# Patient Record
Sex: Female | Born: 1975 | Race: Black or African American | Hispanic: No | Marital: Single | Smoking: Former smoker
Health system: Southern US, Community
[De-identification: ages and names within clinical notes are randomized; demographics above are authoritative.]

## PROBLEM LIST (undated history)

## (undated) DIAGNOSIS — J449 Chronic obstructive pulmonary disease, unspecified: Secondary | ICD-10-CM

## (undated) DIAGNOSIS — K219 Gastro-esophageal reflux disease without esophagitis: Secondary | ICD-10-CM

## (undated) DIAGNOSIS — N6009 Solitary cyst of unspecified breast: Secondary | ICD-10-CM

## (undated) DIAGNOSIS — E7800 Pure hypercholesterolemia, unspecified: Secondary | ICD-10-CM

## (undated) HISTORY — PX: OOPHORECTOMY: SHX86

## (undated) HISTORY — PX: BREAST SURGERY: SHX581

## (undated) HISTORY — PX: HERNIA REPAIR: SHX51

---

## 2005-04-17 ENCOUNTER — Emergency Department (HOSPITAL_COMMUNITY): Admission: EM | Admit: 2005-04-17 | Discharge: 2005-04-17 | Payer: Self-pay | Admitting: Emergency Medicine

## 2010-02-11 ENCOUNTER — Ambulatory Visit (HOSPITAL_COMMUNITY): Admission: RE | Admit: 2010-02-11 | Discharge: 2010-02-11 | Payer: Self-pay | Admitting: Family Medicine

## 2011-12-09 ENCOUNTER — Encounter (HOSPITAL_COMMUNITY): Payer: Self-pay | Admitting: *Deleted

## 2011-12-09 ENCOUNTER — Emergency Department (HOSPITAL_COMMUNITY): Payer: BC Managed Care – PPO

## 2011-12-09 ENCOUNTER — Emergency Department (HOSPITAL_COMMUNITY)
Admission: EM | Admit: 2011-12-09 | Discharge: 2011-12-09 | Disposition: A | Discharge status: Home / Self Care | Payer: BC Managed Care – PPO | Attending: Emergency Medicine | Admitting: Emergency Medicine

## 2011-12-09 DIAGNOSIS — K219 Gastro-esophageal reflux disease without esophagitis: Secondary | ICD-10-CM | POA: Insufficient documentation

## 2011-12-09 DIAGNOSIS — D259 Leiomyoma of uterus, unspecified: Secondary | ICD-10-CM | POA: Insufficient documentation

## 2011-12-09 DIAGNOSIS — E789 Disorder of lipoprotein metabolism, unspecified: Secondary | ICD-10-CM | POA: Insufficient documentation

## 2011-12-09 DIAGNOSIS — R630 Anorexia: Secondary | ICD-10-CM | POA: Insufficient documentation

## 2011-12-09 DIAGNOSIS — R112 Nausea with vomiting, unspecified: Secondary | ICD-10-CM | POA: Insufficient documentation

## 2011-12-09 DIAGNOSIS — Z79899 Other long term (current) drug therapy: Secondary | ICD-10-CM | POA: Insufficient documentation

## 2011-12-09 DIAGNOSIS — F17200 Nicotine dependence, unspecified, uncomplicated: Secondary | ICD-10-CM | POA: Insufficient documentation

## 2011-12-09 DIAGNOSIS — D279 Benign neoplasm of unspecified ovary: Secondary | ICD-10-CM | POA: Insufficient documentation

## 2011-12-09 DIAGNOSIS — R10819 Abdominal tenderness, unspecified site: Secondary | ICD-10-CM | POA: Insufficient documentation

## 2011-12-09 DIAGNOSIS — R197 Diarrhea, unspecified: Secondary | ICD-10-CM | POA: Insufficient documentation

## 2011-12-09 DIAGNOSIS — R109 Unspecified abdominal pain: Secondary | ICD-10-CM | POA: Insufficient documentation

## 2011-12-09 DIAGNOSIS — D219 Benign neoplasm of connective and other soft tissue, unspecified: Secondary | ICD-10-CM

## 2011-12-09 HISTORY — DX: Pure hypercholesterolemia, unspecified: E78.00

## 2011-12-09 HISTORY — DX: Gastro-esophageal reflux disease without esophagitis: K21.9

## 2011-12-09 LAB — COMPREHENSIVE METABOLIC PANEL
ALT: 27 U/L (ref 0–35)
Albumin: 4.3 g/dL (ref 3.5–5.2)
Alkaline Phosphatase: 75 U/L (ref 39–117)
BUN: 10 mg/dL (ref 6–23)
Chloride: 100 mEq/L (ref 96–112)
Creatinine, Ser: 0.7 mg/dL (ref 0.50–1.10)
GFR calc non Af Amer: >90 mL/min (ref >90)
Sodium: 135 mEq/L (ref 135–145)
Total Bilirubin: 0.1 mg/dL — ABNORMAL LOW (ref 0.3–1.2)

## 2011-12-09 LAB — URINALYSIS, ROUTINE W REFLEX MICROSCOPIC
Ketones, ur: 40 mg/dL — AB
Nitrite: NEGATIVE
Specific Gravity, Urine: >1.03 — ABNORMAL HIGH (ref 1.005–1.030)

## 2011-12-09 LAB — CBC
HCT: 38.7 % (ref 36.0–46.0)
MCH: 31.3 pg (ref 26.0–34.0)
MCHC: 33.3 g/dL (ref 30.0–36.0)
Platelets: 271 10*3/uL (ref 150–400)
RBC: 4.12 MIL/uL (ref 3.87–5.11)

## 2011-12-09 LAB — URINE MICROSCOPIC-ADD ON

## 2011-12-09 LAB — PREGNANCY, URINE: Preg Test, Ur: NEGATIVE

## 2011-12-09 LAB — LIPASE, BLOOD: Lipase: 23 U/L (ref 11–59)

## 2011-12-09 MED ORDER — SODIUM CHLORIDE 0.9 % IV SOLN: INTRAVENOUS | Status: DC

## 2011-12-09 MED ORDER — SODIUM CHLORIDE 0.9 % IV BOLUS (SEPSIS)
250.0000 mL | Freq: Once | INTRAVENOUS | Status: AC
  Administered 2011-12-09: 250 mL via INTRAVENOUS

## 2011-12-09 MED ORDER — NAPROXEN 500 MG PO TABS: 500.0000 mg | ORAL_TABLET | Freq: Two times a day (BID) | ORAL | Status: DC

## 2011-12-09 MED ORDER — HYDROMORPHONE HCL PF 1 MG/ML IJ SOLN
1.0000 mg | Freq: Once | INTRAMUSCULAR | Status: AC
  Administered 2011-12-09: 1 mg via INTRAVENOUS
  Filled 2011-12-09: qty 1

## 2011-12-09 MED ORDER — PROMETHAZINE HCL 25 MG PO TABS: 25.0000 mg | ORAL_TABLET | Freq: Four times a day (QID) | ORAL | Status: DC | PRN

## 2011-12-09 MED ORDER — IOHEXOL 300 MG/ML  SOLN
40.0000 mL | Freq: Once | INTRAMUSCULAR | Status: AC | PRN
  Administered 2011-12-09: 40 mL via ORAL

## 2011-12-09 MED ORDER — IOHEXOL 300 MG/ML  SOLN
100.0000 mL | Freq: Once | INTRAMUSCULAR | Status: AC | PRN
  Administered 2011-12-09: 100 mL via INTRAVENOUS

## 2011-12-09 MED ORDER — ONDANSETRON HCL 4 MG/2ML IJ SOLN
4.0000 mg | Freq: Once | INTRAMUSCULAR | Status: AC
  Administered 2011-12-09: 4 mg via INTRAVENOUS
  Filled 2011-12-09: qty 2

## 2011-12-09 MED ORDER — HYDROCODONE-ACETAMINOPHEN 5-325 MG PO TABS: 1.0000 | ORAL_TABLET | Freq: Four times a day (QID) | ORAL | Status: AC | PRN

## 2011-12-09 NOTE — Discharge Instructions (Signed)
Followup with GYN referral is been given to Dr. Emelda Fear.: Make an appointment. Take pain medicine anti-inflammatory medicine as needed. Take antinausea medicine as needed. Return for new or worse symptoms.

## 2011-12-09 NOTE — ED Provider Notes (Addendum)
History    This chart was scribed for Shelda Jakes, MD by Toya Smothers. The patient was seen in room APA12/APA12. Patient's care was started at 1032.   CSN: [id number]  Arrival date & time 12/09/11  1032   First MD Initiated Contact with Patient 12/09/11 1121      Chief Complaint  Patient presents with  . Abdominal Pain    (Consider location/radiation/quality/duration/timing/severity/associated sxs/prior treatment) HPI Brooke Greene is a 36 y.o. female who presents to the Emergency Department complaining of intermittent severe abdominal pain described as cramping radiating towards the left side onset one week ago. Pt. Rates pain 8/10 currently, 10 out of 10 at its worst and lists nausea, emesis, diarrhea, and decreased appetite as associated symptoms. States abdominal pain is aggravated by eating. Pt denies hematochezia, swelling in the legs, sore throat, congestion, back pain, dysuria, and the use of antibiotics. Reports she was recently evaluated by PCP for hematochezia and was treated for hemorrhoids with resolution of problem. Pts state last known menstrual period 2 days ago and regular. Pt lists PCP as [facility].    Past Medical History  Diagnosis Date  . High cholesterol   . Acid reflux     Past Surgical History  Procedure Date  . Hernia repair     No family history on file.  History  Substance Use Topics  . Smoking status: Current Everyday Smoker    Types: Cigarettes  . Smokeless tobacco: Not on file  . Alcohol Use: Yes     occasional    OB History    Grav Para Term Preterm Abortions TAB SAB Ect Mult Living                  Review of Systems  Constitutional: Positive for appetite change. Negative for fever.  HENT: Negative for rhinorrhea.   Eyes: Negative for pain.  Respiratory: Negative for cough and shortness of breath.   Cardiovascular: Negative for chest pain.  Gastrointestinal: Positive for nausea, vomiting, abdominal pain and diarrhea.    Genitourinary: Negative for dysuria.  Musculoskeletal: Negative for back pain.  Skin: Negative for rash.  Neurological: Negative for weakness and headaches.    Allergies  Penicillins  Home Medications   Current Outpatient Rx  Name Route Sig Dispense Refill  . HYDROCORTISONE 0.5 % EX CREA Topical Apply 1 application topically 2 (two) times daily. For hemorrhoids    . LORATADINE 10 MG PO TABS Oral Take 10 mg by mouth daily.    . MOMETASONE FUROATE 50 MCG/ACT NA SUSP Each Nare Place 2 sprays into both nostrils daily.    Marland Kitchen PRESCRIPTION MEDICATION Oral Take 1 tablet by mouth daily. For cholesterol, unknown name or strength    . PRESCRIPTION MEDICATION Oral Take 1 capsule by mouth daily. Unknown name or strength. For upset stomach    . HYDROCODONE-ACETAMINOPHEN 5-325 MG PO TABS Oral Take 1-2 tablets by mouth every 6 (six) hours as needed for pain. 14 tablet 0  . NAPROXEN 500 MG PO TABS Oral Take 1 tablet (500 mg total) by mouth 2 (two) times daily. 14 tablet 0  . PROMETHAZINE HCL 25 MG PO TABS Oral Take 1 tablet (25 mg total) by mouth every 6 (six) hours as needed for nausea. 12 tablet 0    BP 110/80  Pulse 75  Temp(Src) 98.2 F (36.8 C) (Oral)  Resp 16  Ht 5\' 3"  (1.6 m)  Wt 98 lb (44.453 kg)  BMI 17.36 kg/m2  SpO2 98%  LMP 11/30/2011  Physical Exam  Nursing note and vitals reviewed. Constitutional: She is oriented to person, place, and time. She appears well-developed and well-nourished. No distress.  HENT:  Head: Normocephalic and atraumatic.  Mouth/Throat: Oropharynx is clear and moist.  Eyes: EOM are normal. Pupils are equal, round, and reactive to light.  Neck: Neck supple. No tracheal deviation present.  Cardiovascular: Normal rate, regular rhythm, normal heart sounds and intact distal pulses.  Exam reveals no gallop and no friction rub.   No murmur heard. Pulmonary/Chest: Effort normal. No respiratory distress.  Abdominal: Soft. Bowel sounds are normal. She exhibits no  distension. There is tenderness (diffuse). There is no rebound and no guarding.       No Distension  Musculoskeletal: Normal range of motion. She exhibits no edema.  Neurological: She is alert and oriented to person, place, and time. She has normal reflexes. No cranial nerve deficit or sensory deficit. Coordination normal.  Skin: Skin is warm and dry.  Psychiatric: She has a normal mood and affect. Her behavior is normal.    ED Course  Procedures (including critical care time) DIAGNOSTIC STUDIES: Oxygen Saturation is 100% on room air, normal by my interpretation.    COORDINATION OF CARE:    Labs Reviewed  URINALYSIS, ROUTINE W REFLEX MICROSCOPIC - Abnormal; Notable for the following:    Specific Gravity, Urine >1.030 (*)    Hgb urine dipstick TRACE (*)    Ketones, ur 40 (*)    All other components within normal limits  COMPREHENSIVE METABOLIC PANEL - Abnormal; Notable for the following:    Potassium 3.1 (*)    Glucose, Bld 69 (*)    Total Bilirubin 0.1 (*)    All other components within normal limits  CBC - Abnormal; Notable for the following:    WBC 3.9 (*)    All other components within normal limits  URINE MICROSCOPIC-ADD ON - Abnormal; Notable for the following:    Squamous Epithelial / LPF FEW (*)    Bacteria, UA MANY (*)    All other components within normal limits  PREGNANCY, URINE  LIPASE, BLOOD   Ct Abdomen Pelvis W Contrast  12/09/2011  *RADIOLOGY REPORT*  Clinical Data: Abdominal pain  CT ABDOMEN AND PELVIS WITH CONTRAST  Technique:  Multidetector CT imaging of the abdomen and pelvis was performed following the standard protocol during bolus administration of intravenous contrast.  Contrast: OMNIPAQUE IOHEXOL 300 MG/ML  SOLN  Comparison: None.  Findings: There is mild plate-like atelectasis in the lung bases.  There is no focal liver abnormality.  The spleen appears normal.  The adrenal glands are normal.  The normal appearance of the right kidney.  The left  kidney is also normal.  No upper abdominal adenopathy identified.  There is no pelvic or inguinal adenopathy.  The urinary bladder appears normal.  There is a low density and slightly heterogeneous mass within the anterior myometrium which measures 2.3 x 2.0 cm.  There is slightly heterogeneous fluid distention of the uterine cavity.  Posterior to the uterus is a large, complex mass which measures 5.1 x 6.9 cm.  This has osseous and fat elements and is most compatible with a dermoid.  Small amount of free fluid is identified within the pelvis.  The stomach and the small bowel loops are normal.  No obstruction.  The appendix is difficult to visualize separate from the right lower quadrant bowel loops.  Review of the visualized osseous structures is unremarkable.  IMPRESSION: 1.  Large complex mass within the left posterior pelvis has imaging characteristics compatible with ovarian dermoid.  This may predispose the patient to ovarian torsion.  If the patient is currently symptomatic I would advise further evaluation with pelvic sonogram and ovarian Doppler. 2.  Low density mass arising from the ventral wall of the uterus. 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  This would be better assessed with pelvic sonogram. 3.  Fluid distention of the uterine cavity which appears slightly heterogeneous.  Attention on this area on pelvic sonogram advised.  Original Report Authenticated By: Rosealee Albee, M.D.   Results for orders placed during the hospital encounter of 12/09/11  URINALYSIS, ROUTINE W REFLEX MICROSCOPIC      Component Value Range   Color, Urine YELLOW  YELLOW    APPearance CLEAR  CLEAR    Specific Gravity, Urine >1.030 (*) 1.005 - 1.030    pH 6.0  5.0 - 8.0    Glucose, UA NEGATIVE  NEGATIVE (mg/dL)   Hgb urine dipstick TRACE (*) NEGATIVE    Bilirubin Urine NEGATIVE  NEGATIVE    Ketones, ur 40 (*) NEGATIVE (mg/dL)   Protein, ur NEGATIVE  NEGATIVE (mg/dL)   Urobilinogen, UA 0.2  0.0 - 1.0  (mg/dL)   Nitrite NEGATIVE  NEGATIVE    Leukocytes, UA NEGATIVE  NEGATIVE   PREGNANCY, URINE      Component Value Range   Preg Test, Ur NEGATIVE  NEGATIVE   COMPREHENSIVE METABOLIC PANEL      Component Value Range   Sodium 135  135 - 145 (mEq/L)   Potassium 3.1 (*) 3.5 - 5.1 (mEq/L)   Chloride 100  96 - 112 (mEq/L)   CO2 25  19 - 32 (mEq/L)   Glucose, Bld 69 (*) 70 - 99 (mg/dL)   BUN 10  6 - 23 (mg/dL)   Creatinine, Ser 4.01  0.50 - 1.10 (mg/dL)   Calcium 9.4  8.4 - 02.7 (mg/dL)   Total Protein 7.7  6.0 - 8.3 (g/dL)   Albumin 4.3  3.5 - 5.2 (g/dL)   AST 32  0 - 37 (U/L)   ALT 27  0 - 35 (U/L)   Alkaline Phosphatase 75  39 - 117 (U/L)   Total Bilirubin 0.1 (*) 0.3 - 1.2 (mg/dL)   GFR calc non Af Amer >90  >90 (mL/min)   GFR calc Af Amer >90  >90 (mL/min)  CBC      Component Value Range   WBC 3.9 (*) 4.0 - 10.5 (K/uL)   RBC 4.12  3.87 - 5.11 (MIL/uL)   Hemoglobin 12.9  12.0 - 15.0 (g/dL)   HCT 25.3  66.4 - 40.3 (%)   MCV 93.9  78.0 - 100.0 (fL)   MCH 31.3  26.0 - 34.0 (pg)   MCHC 33.3  30.0 - 36.0 (g/dL)   RDW 47.4  25.9 - 56.3 (%)   Platelets 271  150 - 400 (K/uL)  LIPASE, BLOOD      Component Value Range   Lipase 23  11 - 59 (U/L)  URINE MICROSCOPIC-ADD ON      Component Value Range   Squamous Epithelial / LPF FEW (*) RARE    WBC, UA 7-10  <3 (WBC/hpf)   RBC / HPF 7-10  <3 (RBC/hpf)   Bacteria, UA MANY (*) RARE      1. Abdominal pain   2. Dermoid cyst of ovary   3. Fibroids       MDM  Patient presents with abdominal  pain for several days. Patient currently on her menses so urinalysis obtained today is most likely not reflective of urinary tract infection. CT scan raise suspicion for possible ovarian torsion due to the dermoid. And also found uterine fibroids. Patient was not aware of either one of these she has not had an ultrasound or CT scan in the past. She has not had a baby in the past. Pregnancy test was negative no significant leukocytosis no significant  electrolyte abnormalities.  Ultrasound has been ordered to rule out torsion results of that are still pending if negative patient can followup with GYN referral made. If positive for torsion GYN will need to be contacted today.     Also of importance on the UA is at her leukocyte esterase was negative.  Addendum: Ultrasound without new findings patient refused transvaginal aspect of the ultrasound so portion could not be ruled out based on that patient's pain this is still possibility however it has been present for more than 12 hours so most likely if present the ovary would not be salvageable at this point in time. We'll discharge patient home with this short coming that the patient is aware of and have her followup with GYN.   I personally performed the services described in this documentation, which was scribed in my presence. The recorded information has been reviewed and considered.     Shelda Jakes, MD 12/09/11 1519  Shelda Jakes, MD 12/09/11 [phone]

## 2011-12-09 NOTE — ED Notes (Signed)
C/o decreased appetite, n/v/d, and abd pain x 2 days.

## 2011-12-09 NOTE — ED Notes (Signed)
Patient transported to Ultrasound. Family in room denies needs at present.

## 2011-12-09 NOTE — ED Notes (Signed)
Pt ambulated to bathroom with no difficulty 

## 2011-12-25 ENCOUNTER — Encounter (HOSPITAL_COMMUNITY): Payer: Self-pay | Admitting: Pharmacist

## 2011-12-26 ENCOUNTER — Other Ambulatory Visit: Payer: Self-pay | Admitting: Obstetrics & Gynecology

## 2011-12-28 NOTE — Patient Instructions (Signed)
20 Brooke Greene  12/28/2011   Your procedure is scheduled on:  Friday, May 24  [facility] to [facility] [place] [facility].  Call this number if you have problems the morning of surgery: [phone]   Remember:   Do not eat food:After Midnight.  May have clear liquids:until Midnight .  Clear liquids include soda, tea, black coffee, apple or grape juice, broth.  Take these medicines the morning of surgery with A SIP OF WATER: Claritin, Prilosec. Norco, if needed.   Do not wear jewelry, make-up or nail polish.  Do not wear lotions, powders, or perfumes. You may wear deodorant.  Do not shave 48 hours prior to surgery. Men may shave face and neck.  Do not bring valuables to the hospital.  Contacts, dentures or bridgework may not be worn into surgery.  Leave suitcase in the car. After surgery it may be brought to your room.  For patients admitted to the hospital, checkout time is 11:00 AM the day of discharge.   Patients discharged the day of surgery will not be allowed to drive home.  Name and phone number of your driver: Family  Special Instructions: CHG Shower Use Special Wash: 1/2 bottle night before surgery and 1/2 bottle morning of surgery.   Please read over the following fact sheets that you were given: Pain Booklet, Coughing and Deep Breathing, Surgical Site Infection Prevention, Anesthesia Post-op Instructions and Care and Recovery After Surgery  PATIENT INSTRUCTIONS POST-ANESTHESIA  IMMEDIATELY FOLLOWING SURGERY:  Do not drive or operate machinery for the first twenty four hours after surgery.  Do not make any important decisions for twenty four hours after surgery or while taking narcotic pain medications or sedatives.  If you develop intractable nausea and vomiting or a severe headache please notify your doctor immediately.  FOLLOW-UP:  Please make an appointment with your surgeon as instructed. You do not need to follow up with anesthesia unless specifically instructed to do so.  WOUND  CARE INSTRUCTIONS (if applicable):  Keep a dry clean dressing on the anesthesia/puncture wound site if there is drainage.  Once the wound has quit draining you may leave it open to air.  Generally you should leave the bandage intact for twenty four hours unless there is drainage.  If the epidural site drains for more than 36-48 hours please call the anesthesia department.  QUESTIONS?:  Please feel free to call your physician or the hospital operator if you have any questions, and they will be happy to assist you.     Ovarian Cyst The ovaries are small organs that are on each side of the uterus. The ovaries are the organs that produce the female hormones, estrogen and progesterone. An ovarian cyst is a sac filled with fluid that can vary in its size. It is normal for a small cyst to form in women who are in the childbearing age and who have menstrual periods. This type of cyst is called a follicle cyst that becomes an ovulation cyst (corpus luteum cyst) after it produces the women's egg. It later goes away on its own if the woman does not become pregnant. There are other kinds of ovarian cysts that may cause problems and may need to be treated. The most serious problem is a cyst with cancer. It should be noted that menopausal women who have an ovarian cyst are at a higher risk of it being a cancer cyst. They should be evaluated very quickly, thoroughly and followed closely. This is especially true  in menopausal women because of the high rate of ovarian cancer in women in menopause. CAUSES AND TYPES OF OVARIAN CYSTS:  FUNCTIONAL CYST: The follicle/corpus luteum cyst is a functional cyst that occurs every month during ovulation with the menstrual cycle. They go away with the next menstrual cycle if the woman does not get pregnant. Usually, there are no symptoms with a functional cyst.   ENDOMETRIOMA CYST: This cyst develops from the lining of the uterus tissue. This cyst gets in or on the ovary. It grows  every month from the bleeding during the menstrual period. It is also called a "chocolate cyst" because it becomes filled with blood that turns brown. This cyst can cause pain in the lower abdomen during intercourse and with your menstrual period.   CYSTADENOMA CYST: This cyst develops from the cells on the outside of the ovary. They usually are not cancerous. They can get very big and cause lower abdomen pain and pain with intercourse. This type of cyst can twist on itself, cut off its blood supply and cause severe pain. It also can easily rupture and cause a lot of pain.   DERMOID CYST: This type of cyst is sometimes found in both ovaries. They are found to have different kinds of body tissue in the cyst. The tissue includes skin, teeth, hair, and/or cartilage. They usually do not have symptoms unless they get very big. Dermoid cysts are rarely cancerous.   POLYCYSTIC OVARY: This is a rare condition with hormone problems that produces many small cysts on both ovaries. The cysts are follicle-like cysts that never produce an egg and become a corpus luteum. It can cause an increase in body weight, infertility, acne, increase in body and facial hair and lack of menstrual periods or rare menstrual periods. Many women with this problem develop type 2 diabetes. The exact cause of this problem is unknown. A polycystic ovary is rarely cancerous.   THECA LUTEIN CYST: Occurs when too much hormone (human chorionic gonadotropin) is produced and over-stimulates the ovaries to produce an egg. They are frequently seen when doctors stimulate the ovaries for invitro-fertilization (test tube babies).   LUTEOMA CYST: This cyst is seen during pregnancy. Rarely it can cause an obstruction to the birth canal during labor and delivery. They usually go away after delivery.  SYMPTOMS   Pelvic pain or pressure.   Pain during sexual intercourse.   Increasing girth (swelling) of the abdomen.   Abnormal menstrual periods.     Increasing pain with menstrual periods.   You stop having menstrual periods and you are not pregnant.  DIAGNOSIS  The diagnosis can be made during:  Routine or annual pelvic examination (common).   Ultrasound.   X-ray of the pelvis.   CT Scan.   MRI.   Blood tests.  TREATMENT   Treatment may only be to follow the cyst monthly for 2 to 3 months with your caregiver. Many go away on their own, especially functional cysts.   May be aspirated (drained) with a long needle with ultrasound, or by laparoscopy (inserting a tube into the pelvis through a small incision).   The whole cyst can be removed by laparoscopy.   Sometimes the cyst may need to be removed through an incision in the lower abdomen.   Hormone treatment is sometimes used to help dissolve certain cysts.   Birth control pills are sometimes used to help dissolve certain cysts.  HOME CARE INSTRUCTIONS  Follow your caregiver's advice regarding:  Medicine.  Follow up visits to evaluate and treat the cyst.   You may need to come back or make an appointment with another caregiver, to find the exact cause of your cyst, if your caregiver is not a gynecologist.   Get your yearly and recommended pelvic examinations and Pap tests.   Let your caregiver know if you have had an ovarian cyst in the past.  SEEK MEDICAL CARE IF:   Your periods are late, irregular, they stop, or are painful.   Your stomach (abdomen) or pelvic pain does not go away.   Your stomach becomes larger or swollen.   You have pressure on your bladder or trouble emptying your bladder completely.   You have painful sexual intercourse.   You have feelings of fullness, pressure, or discomfort in your stomach.   You lose weight for no apparent reason.   You feel generally ill.   You become constipated.   You lose your appetite.   You develop acne.   You have an increase in body and facial hair.   You are gaining weight, without changing  your exercise and eating habits.   You think you are pregnant.  SEEK IMMEDIATE MEDICAL CARE IF:   You have increasing abdominal pain.   You feel sick to your stomach (nausea) and/or vomit.   You develop a fever that comes on suddenly.   You develop abdominal pain during a bowel movement.   Your menstrual periods become heavier than usual.    Salpingectomy Salpingectomy, also called tubectomy, is the removal of one of the fallopian tubes with surgery. The fallopian tubes are the tubes that are connected to the uterus. These tubes transport the egg from the ovary to the womb (uterus). Removing one tube does not prevent you from becoming pregnant. Salpingectomy does not have any adverse affects on your menstrual periods. There are many reasons to have a salpingectomy, such as if:  You have a tubal (ectopic) pregnancy. This is especially true if the tube ruptures.   You have an infected tube.   It is necessary to remove the tube when removing an ovary with a cyst or tumor.   The uterus needs to be removed.   You need surgery for cancer of the tube or other female organs.  LET YOUR CAREGIVER KNOW ABOUT:  Allergies to foods or medications.   All the medications you are taking. This includes over-the-counter and prescription drugs, herbs, eye drops, creams and steroids.   If you are using illegal drugs or drinking alcohol.   Your smoking habits.   Previous problems with anesthesia including numbing medication.   The possibility of being pregnant.   History of blood clotting or other blood problems.   Previous surgery.   Other medical or health problems.  RISKS AND COMPLICATIONS   Injury to surrounding organs.   Bleeding.   Infection.   The surgery does not correct the problem.   You have problems with the anesthesia.  BEFORE THE PROCEDURE  Do not take aspirin or blood thinners. They can cause bleeding.   Do not eat or drink anything at least 8 hours before the  surgery.   Let your caregiver know if you develop a cold or an infection.   If you are being admitted the day of surgery, arrive at least one hour before the surgery to read and sign the necessary forms and consents.   Arrange for help when you go home from the hospital.   If you smoke,  do not smoke for at least 2 weeks before the surgery.  PROCEDURE  You will be given an IV (intravenous) and a medication to relax you. Then, you will be put to sleep with a drug (anesthetic). Any hair on your lower belly (abdomen) will be removed and a catheter will be placed in your bladder. Through 2 very small cuts (incisions) if you have a laparoscopy, or a large incision in the lower abdomen, the fallopian tube will be removed from where it attaches to the uterus. The blood vessels will be clamped and tied.  AFTER THE PROCEDURE   You will be taken to the recovery room and observed for 1 to 3 hours.   If you had a laparoscopy, you may be discharged after several hours.   If you had a large incision, you will be admitted to the hospital for a couple of days.   If you had a laparoscopy, you may have shoulder pain. This is not unusual and is from some air that is left in the abdomen. This air affects the nerve that goes from the diaphragm to the shoulder. It goes away in a day or two.   You will be given pain medication if needed.   The intravenous and catheter will be removed before you are discharged.   Have someone available to take you home.  Document Released: 12/17/2008 Document Revised: 07/20/2011 Document Reviewed: 12/17/2008 [facility] Patient Information 2012 [place], [place].

## 2011-12-29 ENCOUNTER — Encounter (HOSPITAL_COMMUNITY): Payer: Self-pay

## 2011-12-29 ENCOUNTER — Encounter (HOSPITAL_COMMUNITY)
Admission: RE | Admit: 2011-12-29 | Discharge: 2011-12-29 | Disposition: A | Discharge status: Home / Self Care | Payer: BC Managed Care – PPO | Source: Ambulatory Visit | Attending: Obstetrics & Gynecology | Admitting: Obstetrics & Gynecology

## 2011-12-29 LAB — URINALYSIS, ROUTINE W REFLEX MICROSCOPIC
Glucose, UA: NEGATIVE mg/dL
Ketones, ur: NEGATIVE mg/dL
Leukocytes, UA: NEGATIVE
Protein, ur: 30 mg/dL — AB
pH: 6 (ref 5.0–8.0)

## 2011-12-29 LAB — COMPREHENSIVE METABOLIC PANEL
ALT: 14 U/L (ref 0–35)
AST: 23 U/L (ref 0–37)
Albumin: 3.9 g/dL (ref 3.5–5.2)
CO2: 28 mEq/L (ref 19–32)
Chloride: 104 mEq/L (ref 96–112)
GFR calc non Af Amer: >90 mL/min (ref >90)
Potassium: 4 mEq/L (ref 3.5–5.1)
Sodium: 141 mEq/L (ref 135–145)
Total Bilirubin: 0.1 mg/dL — ABNORMAL LOW (ref 0.3–1.2)

## 2011-12-29 LAB — CBC
MCV: 91.9 fL (ref 78.0–100.0)
Platelets: 265 10*3/uL (ref 150–400)
RBC: 3.84 MIL/uL — ABNORMAL LOW (ref 3.87–5.11)
RDW: 13.3 % (ref 11.5–15.5)
WBC: 6.8 10*3/uL (ref 4.0–10.5)

## 2011-12-29 LAB — SURGICAL PCR SCREEN
MRSA, PCR: NEGATIVE
Staphylococcus aureus: NEGATIVE

## 2011-12-29 LAB — URINE MICROSCOPIC-ADD ON

## 2012-01-05 ENCOUNTER — Encounter (HOSPITAL_COMMUNITY): Payer: Self-pay | Admitting: Anesthesiology

## 2012-01-05 ENCOUNTER — Ambulatory Visit (HOSPITAL_COMMUNITY): Payer: BC Managed Care – PPO | Admitting: Anesthesiology

## 2012-01-05 ENCOUNTER — Ambulatory Visit (HOSPITAL_COMMUNITY)
Admission: RE | Admit: 2012-01-05 | Discharge: 2012-01-05 | Disposition: A | Discharge status: Home / Self Care | Payer: BC Managed Care – PPO | Source: Ambulatory Visit | Attending: Obstetrics & Gynecology | Admitting: Obstetrics & Gynecology

## 2012-01-05 ENCOUNTER — Encounter (HOSPITAL_COMMUNITY): Payer: Self-pay | Admitting: *Deleted

## 2012-01-05 ENCOUNTER — Encounter (HOSPITAL_COMMUNITY)
Admission: RE | Disposition: A | Discharge status: Home / Self Care | Payer: Self-pay | Source: Ambulatory Visit | Attending: Obstetrics & Gynecology

## 2012-01-05 DIAGNOSIS — R1032 Left lower quadrant pain: Secondary | ICD-10-CM | POA: Insufficient documentation

## 2012-01-05 DIAGNOSIS — D279 Benign neoplasm of unspecified ovary: Secondary | ICD-10-CM | POA: Insufficient documentation

## 2012-01-05 DIAGNOSIS — F17200 Nicotine dependence, unspecified, uncomplicated: Secondary | ICD-10-CM | POA: Insufficient documentation

## 2012-01-05 DIAGNOSIS — E7800 Pure hypercholesterolemia, unspecified: Secondary | ICD-10-CM | POA: Insufficient documentation

## 2012-01-05 DIAGNOSIS — N839 Noninflammatory disorder of ovary, fallopian tube and broad ligament, unspecified: Secondary | ICD-10-CM | POA: Insufficient documentation

## 2012-01-05 DIAGNOSIS — Z01812 Encounter for preprocedural laboratory examination: Secondary | ICD-10-CM | POA: Insufficient documentation

## 2012-01-05 SURGERY — SALPINGO-OOPHORECTOMY, LAPAROSCOPIC
Anesthesia: General | Laterality: Left | Wound class: Clean

## 2012-01-05 MED ORDER — GLYCOPYRROLATE 0.2 MG/ML IJ SOLN
INTRAMUSCULAR | Status: DC | PRN
  Administered 2012-01-05: 0.2 mg via INTRAVENOUS

## 2012-01-05 MED ORDER — ONDANSETRON HCL 8 MG PO TABS: 8.0000 mg | ORAL_TABLET | Freq: Three times a day (TID) | ORAL | Status: AC | PRN

## 2012-01-05 MED ORDER — FENTANYL CITRATE 0.05 MG/ML IJ SOLN
INTRAMUSCULAR | Status: AC
  Filled 2012-01-05: qty 2

## 2012-01-05 MED ORDER — ONDANSETRON HCL 4 MG/2ML IJ SOLN: 4.0000 mg | Freq: Once | INTRAMUSCULAR | Status: DC | PRN

## 2012-01-05 MED ORDER — KETOROLAC TROMETHAMINE 30 MG/ML IJ SOLN
30.0000 mg | Freq: Once | INTRAMUSCULAR | Status: AC
  Administered 2012-01-05: 30 mg via INTRAVENOUS

## 2012-01-05 MED ORDER — NALOXONE HCL 0.4 MG/ML IJ SOLN
INTRAMUSCULAR | Status: AC
  Filled 2012-01-05: qty 1

## 2012-01-05 MED ORDER — NEOSTIGMINE METHYLSULFATE 1 MG/ML IJ SOLN
INTRAMUSCULAR | Status: DC | PRN
  Administered 2012-01-05: 3 mg via INTRAVENOUS

## 2012-01-05 MED ORDER — ROCURONIUM BROMIDE 100 MG/10ML IV SOLN
INTRAVENOUS | Status: DC | PRN
  Administered 2012-01-05: 30 mg via INTRAVENOUS

## 2012-01-05 MED ORDER — LIDOCAINE HCL (PF) 1 % IJ SOLN
INTRAMUSCULAR | Status: AC
  Filled 2012-01-05: qty 5

## 2012-01-05 MED ORDER — MIDAZOLAM HCL 5 MG/5ML IJ SOLN
INTRAMUSCULAR | Status: DC | PRN
  Administered 2012-01-05: 2 mg via INTRAVENOUS

## 2012-01-05 MED ORDER — PROPOFOL 10 MG/ML IV EMUL
INTRAVENOUS | Status: AC
  Filled 2012-01-05: qty 20

## 2012-01-05 MED ORDER — CLINDAMYCIN PHOSPHATE 900 MG/50ML IV SOLN
900.0000 mg | INTRAVENOUS | Status: AC
  Administered 2012-01-05: 900 mg via INTRAVENOUS

## 2012-01-05 MED ORDER — GLYCOPYRROLATE 0.2 MG/ML IJ SOLN
INTRAMUSCULAR | Status: AC
  Filled 2012-01-05: qty 1

## 2012-01-05 MED ORDER — PROPOFOL 10 MG/ML IV BOLUS
INTRAVENOUS | Status: DC | PRN
  Administered 2012-01-05: 150 mg via INTRAVENOUS

## 2012-01-05 MED ORDER — CLINDAMYCIN PHOSPHATE 900 MG/50ML IV SOLN
INTRAVENOUS | Status: AC
  Administered 2012-01-05: 900 mg via INTRAVENOUS
  Filled 2012-01-05: qty 50

## 2012-01-05 MED ORDER — FENTANYL CITRATE 0.05 MG/ML IJ SOLN
INTRAMUSCULAR | Status: AC
  Filled 2012-01-05: qty 5

## 2012-01-05 MED ORDER — ACETAMINOPHEN 325 MG PO TABS: 325.0000 mg | ORAL_TABLET | ORAL | Status: DC | PRN

## 2012-01-05 MED ORDER — MIDAZOLAM HCL 2 MG/2ML IJ SOLN
1.0000 mg | INTRAMUSCULAR | Status: DC | PRN
  Administered 2012-01-05: 2 mg via INTRAVENOUS

## 2012-01-05 MED ORDER — ONDANSETRON HCL 4 MG/2ML IJ SOLN
INTRAMUSCULAR | Status: AC
  Administered 2012-01-05: 4 mg via INTRAVENOUS
  Filled 2012-01-05: qty 2

## 2012-01-05 MED ORDER — GLYCOPYRROLATE 0.2 MG/ML IJ SOLN
INTRAMUSCULAR | Status: AC
  Administered 2012-01-05: 0.2 mg via INTRAVENOUS
  Filled 2012-01-05: qty 1

## 2012-01-05 MED ORDER — CIPROFLOXACIN IN D5W 400 MG/200ML IV SOLN
INTRAVENOUS | Status: AC
  Filled 2012-01-05: qty 200

## 2012-01-05 MED ORDER — CLINDAMYCIN PHOSPHATE 600 MG/50ML IV SOLN
INTRAVENOUS | Status: DC | PRN
  Administered 2012-01-05: 900 mg via INTRAVENOUS

## 2012-01-05 MED ORDER — NALOXONE HCL 0.4 MG/ML IJ SOLN
INTRAMUSCULAR | Status: DC | PRN
  Administered 2012-01-05: 0.1 mg via INTRAVENOUS

## 2012-01-05 MED ORDER — ROCURONIUM BROMIDE 50 MG/5ML IV SOLN
INTRAVENOUS | Status: AC
  Filled 2012-01-05: qty 1

## 2012-01-05 MED ORDER — FENTANYL CITRATE 0.05 MG/ML IJ SOLN
INTRAMUSCULAR | Status: DC | PRN
  Administered 2012-01-05 (×2): 100 ug via INTRAVENOUS
  Administered 2012-01-05: 150 ug via INTRAVENOUS

## 2012-01-05 MED ORDER — KETOROLAC TROMETHAMINE 10 MG PO TABS: 10.0000 mg | ORAL_TABLET | Freq: Three times a day (TID) | ORAL | Status: AC | PRN

## 2012-01-05 MED ORDER — MIDAZOLAM HCL 2 MG/2ML IJ SOLN
INTRAMUSCULAR | Status: AC
  Filled 2012-01-05: qty 2

## 2012-01-05 MED ORDER — FENTANYL CITRATE 0.05 MG/ML IJ SOLN
INTRAMUSCULAR | Status: AC
  Administered 2012-01-05: 50 ug via INTRAVENOUS
  Filled 2012-01-05: qty 2

## 2012-01-05 MED ORDER — FENTANYL CITRATE 0.05 MG/ML IJ SOLN
25.0000 ug | INTRAMUSCULAR | Status: DC | PRN
  Administered 2012-01-05: 50 ug via INTRAVENOUS

## 2012-01-05 MED ORDER — MIDAZOLAM HCL 2 MG/2ML IJ SOLN
INTRAMUSCULAR | Status: AC
  Administered 2012-01-05: 2 mg via INTRAVENOUS
  Filled 2012-01-05: qty 2

## 2012-01-05 MED ORDER — 0.9 % SODIUM CHLORIDE (POUR BTL) OPTIME
TOPICAL | Status: DC | PRN
  Administered 2012-01-05: 1000 mL

## 2012-01-05 MED ORDER — HYDROMORPHONE HCL PF 1 MG/ML IJ SOLN: 0.5000 mg | INTRAMUSCULAR | Status: DC | PRN

## 2012-01-05 MED ORDER — LIDOCAINE HCL 1 % IJ SOLN
INTRAMUSCULAR | Status: DC | PRN
  Administered 2012-01-05: 30 mg via INTRADERMAL

## 2012-01-05 MED ORDER — CIPROFLOXACIN IN D5W 400 MG/200ML IV SOLN: 400.0000 mg | INTRAVENOUS | Status: DC

## 2012-01-05 MED ORDER — LACTATED RINGERS IV SOLN
INTRAVENOUS | Status: DC
Start: 2012-01-05 — End: 2012-01-05
  Administered 2012-01-05: 1000 mL via INTRAVENOUS

## 2012-01-05 MED ORDER — KETOROLAC TROMETHAMINE 30 MG/ML IJ SOLN
INTRAMUSCULAR | Status: AC
  Administered 2012-01-05: 30 mg via INTRAVENOUS
  Filled 2012-01-05: qty 1

## 2012-01-05 MED ORDER — GLYCOPYRROLATE 0.2 MG/ML IJ SOLN
0.2000 mg | Freq: Once | INTRAMUSCULAR | Status: AC
Start: 2012-01-05 — End: 2012-01-05
  Administered 2012-01-05: 0.2 mg via INTRAVENOUS

## 2012-01-05 MED ORDER — OXYCODONE-ACETAMINOPHEN 7.5-500 MG PO TABS: 1.0000 | ORAL_TABLET | Freq: Four times a day (QID) | ORAL | Status: AC | PRN

## 2012-01-05 MED ORDER — ONDANSETRON HCL 4 MG/2ML IJ SOLN
4.0000 mg | Freq: Once | INTRAMUSCULAR | Status: AC
  Administered 2012-01-05: 4 mg via INTRAVENOUS

## 2012-01-05 MED ORDER — CIPROFLOXACIN IN D5W 400 MG/200ML IV SOLN
INTRAVENOUS | Status: DC | PRN
  Administered 2012-01-05: 400 mg via INTRAVENOUS

## 2012-01-05 SURGICAL SUPPLY — 46 items
BAG HAMPER (MISCELLANEOUS) ×2 IMPLANT
BAG SPEC RTRVL LRG 6X4 10 (ENDOMECHANICALS) ×1
BLADE MORCELLATOR LAPAROSCOPIC (BLADE) IMPLANT
BLADE SURG SZ11 CARB STEEL (BLADE) ×2 IMPLANT
CLOTH BEACON ORANGE TIMEOUT ST (SAFETY) ×2 IMPLANT
COVER LIGHT HANDLE STERIS (MISCELLANEOUS) ×4 IMPLANT
DISSECTOR BLUNT TIP ENDO 5MM (MISCELLANEOUS) IMPLANT
ELECT REM PT RETURN 9FT ADLT (ELECTROSURGICAL) ×2
ELECTRODE REM PT RTRN 9FT ADLT (ELECTROSURGICAL) ×1 IMPLANT
FILTER SMOKE EVAC LAPAROSHD (FILTER) ×2 IMPLANT
FORMALIN 10 PREFIL 480ML (MISCELLANEOUS) ×2 IMPLANT
GAUZE PACKING IODOFORM 1 (PACKING) IMPLANT
GAUZE SPONGE 4X4 16PLY XRAY LF (GAUZE/BANDAGES/DRESSINGS) IMPLANT
GLOVE BIOGEL PI IND STRL 8 (GLOVE) ×1 IMPLANT
GLOVE BIOGEL PI INDICATOR 8 (GLOVE) ×1
GLOVE ECLIPSE 8.0 STRL XLNG CF (GLOVE) ×2 IMPLANT
GLOVE EXAM NITRILE MD LF STRL (GLOVE) ×2 IMPLANT
GLOVE INDICATOR 7.0 STRL GRN (GLOVE) ×2 IMPLANT
GLOVE SS BIOGEL STRL SZ 6.5 (GLOVE) IMPLANT
GLOVE SUPERSENSE BIOGEL SZ 6.5 (GLOVE) ×1
GOWN SRG XL XLNG 56XLVL 4 (GOWN DISPOSABLE) ×1 IMPLANT
GOWN STRL NON-REIN XL XLG LVL4 (GOWN DISPOSABLE)
GOWN STRL REIN XL XLG (GOWN DISPOSABLE) ×2 IMPLANT
HAND ACTIVATED (MISCELLANEOUS) ×2 IMPLANT
INST SET LAPROSCOPIC GYN AP (KITS) ×2 IMPLANT
IV NS IRRIG 3000ML ARTHROMATIC (IV SOLUTION) ×2 IMPLANT
KIT ROOM TURNOVER APOR (KITS) ×2 IMPLANT
KIT TROCAR LAP GYN (TROCAR) ×2 IMPLANT
MANIFOLD NEPTUNE II (INSTRUMENTS) ×2 IMPLANT
PACK PERI GYN (CUSTOM PROCEDURE TRAY) ×2 IMPLANT
PAD ARMBOARD 7.5X6 YLW CONV (MISCELLANEOUS) ×2 IMPLANT
POUCH SPECIMEN RETRIEVAL 10MM (ENDOMECHANICALS) ×1 IMPLANT
SCALPEL HARMONIC ACE (MISCELLANEOUS) ×2 IMPLANT
SET BASIN LINEN APH (SET/KITS/TRAYS/PACK) ×2 IMPLANT
SET TUBE IRRIG SUCTION NO TIP (IRRIGATION / IRRIGATOR) ×2 IMPLANT
SOLUTION ANTI FOG 6CC (MISCELLANEOUS) ×2 IMPLANT
SPONGE GAUZE 2X2 8PLY STRL LF (GAUZE/BANDAGES/DRESSINGS) ×3 IMPLANT
STAPLER VISISTAT 35W (STAPLE) ×2 IMPLANT
SUT VIC AB 2-0 CT2 27 (SUTURE) ×1 IMPLANT
SUT VIC AB 3-0 SH 27 (SUTURE)
SUT VIC AB 3-0 SH 27X BRD (SUTURE) ×1 IMPLANT
SUT VICRYL 0 UR6 27IN ABS (SUTURE) ×2 IMPLANT
TAPE CLOTH SURG 4X10 WHT LF (GAUZE/BANDAGES/DRESSINGS) ×1 IMPLANT
TRAY FOLEY CATH 14FR (SET/KITS/TRAYS/PACK) ×2 IMPLANT
TUBING HI FLO HEAT INSUFFLATOR (IRRIGATION / IRRIGATOR) ×2 IMPLANT
WARMER LAPAROSCOPE (MISCELLANEOUS) ×2 IMPLANT

## 2012-01-05 NOTE — Addendum Note (Signed)
Addendum  created 01/05/12 0945 by Roselie Awkward, MD   Modules edited:Orders, PRL Based Order Sets

## 2012-01-05 NOTE — Transfer of Care (Signed)
Immediate Anesthesia Transfer of Care Note  Patient: Brooke Greene  Procedure(s) Performed: Procedure(s) (LRB): LAPAROSCOPIC SALPINGO OOPHERECTOMY (Left)  Patient Location: PACU  Anesthesia Type: General  Level of Consciousness: awake and patient cooperative  Airway & Oxygen Therapy: Patient Spontanous Breathing and Patient connected to face mask oxygen  Post-op Assessment: Report given to PACU RN, Post -op Vital signs reviewed and stable and Patient moving all extremities  Post vital signs: Reviewed and stable  Complications: No apparent anesthesia complications

## 2012-01-05 NOTE — Op Note (Signed)
Preoperative diagnosis:  1.  Complex left ovarian mass, benign cystic teratoma                                         2.  Increasing left lower quadrant pain  Postoperative diagnosis:  Same as above  Procedure:  Laparoscopic left oophorectomy  Surgeon:  Rockne Coons MD  Anesthesia:  Gen. Endotracheal  Findings:   The patient had been experiencing increasing amounts of left lower quadrant pain over the past 2 years.  She was evaluated with a pelvic scan and found to have a complex left ovarian mass most consistent with a left benign cystic teratoma.  Intraoperatively that was confirmed.  The right ovary did not seem to be affected.   Description of operation:  The patient was taken to the operating room and placed in the supine position where she underwent general endotracheal anesthesia.  She was placed in the low lithotomy position.  She was prepped and draped in the usual sterile fashion and a Foley catheter was placed.  An incision was made in the umbilicus and a Veres needle was placed into the peritoneal cavity with one pass without difficulty.  The peritoneal cavity was then insufflated.  An 11 mm non-bladed direct visualization trocar was then used and placed into the peritoneal cavity with direct laparoscopic visualization again without difficulty.  Incisions were then made in the left lower quadrant and midline suprapubic area.  A 5 mm trocar was placed in the left lower quadrant and a 5 mm trocar was placed in the midline suprapubic.  Both were done under direct visualization without difficulty.  The left ovary was grasped.  The harmonic scalpel was used and the left utero ovarian ligament was grasped and coagulated and transected.  There was good hemostasis.  i did not have to transected the infundibulpelvic ligament  An Endo Catch was placed and the left ovary was put in the bag and removed through the umbilical incision without difficulty.  I did enlarge the umbilical incision just a  bit and opened the cyst up in the bag as I was removing it from the abdomen in order to minimize the incisional enlargement.  There was no intra operative spillage.   The fascia of the umbilical incision was closed with a running 0 Vicryl. The skin x 3 were closed using skin staples.    The patient tolerated the procedure well and there was minimal blood loss.  The patient was awakened from anesthesia and taken to the recovery room in good stable condition with all counts being correct x3.  The estimated blood loss for the procedure is 100 cc almost all of which came from the superficial bleeder in the right lower quadrant.  There was no bleeding from the intraperitoneal surgery at all.  The patient received ciprofloxacin and Cleocin preoperatively.  She also received Toradol IV preoperatively.  She will be discharged from the recovery room time and seen next week for staple and suture removal.  Leonard Feigel H 01/05/2012 9:05 AM

## 2012-01-05 NOTE — Anesthesia Postprocedure Evaluation (Signed)
  Anesthesia Post-op Note  Patient: Brooke Greene  Procedure(s) Performed: Procedure(s) (LRB): LAPAROSCOPIC SALPINGO OOPHERECTOMY (Left)  Patient Location: PACU  Anesthesia Type: General  Level of Consciousness: awake, alert , oriented and patient cooperative  Airway and Oxygen Therapy: Patient Spontanous Breathing  Post-op Pain: 2 /10, mild  Post-op Assessment: Post-op Vital signs reviewed, Patient's Cardiovascular Status Stable, Respiratory Function Stable, Patent Airway, No signs of Nausea or vomiting and Pain level controlled  Post-op Vital Signs: Reviewed and stable  Complications: No apparent anesthesia complications

## 2012-01-05 NOTE — Addendum Note (Signed)
Addendum  created 01/05/12 0947 by Roselie Awkward, MD   Modules edited:Orders

## 2012-01-05 NOTE — Discharge Instructions (Signed)
Laparoscopic Ovarian Torsion Surgery Care After Refer to this sheet in the next few weeks. These instructions provide you with information on caring for yourself after your procedure. Your caregiver may also give you more specific instructions. Your treatment has been planned according to current medical practices, but problems sometimes occur. Call your caregiver if you have any problems or questions after your procedure. HOME CARE INSTRUCTIONS  Take any medicine as directed by your caregiver. Follow the directions carefully.   Check your incisions every day.   Keep the incision area(s) dry. Ask your caregiver when it is safe to shower or bathe again.   Rest as much as possible for the next 3 days. Ask your caregiver when it is safe to go back to your normal activities.   Drink enough fluids to keep your urine clear or pale yellow.   Keep all follow-up appointments. Your caregiver will make sure you are healing the way you should be.  SEEK MEDICAL CARE IF:   You have bleeding or discharge from your vagina.   You have pain in your abdomen.   You feel nauseous.  SEEK IMMEDIATE MEDICAL CARE IF:   Your incision(s) becomes red, swollen, or tender.   Your incision(s) start(s) bleeding.   You have pus coming from any incision.   You have heavy or persistent vaginal bleeding or discharge.   You have severe or increased abdominal pain.   You cannot stop vomiting.   Your nausea will not go away.   You have a fever.  MAKE SURE YOU:  Understand these instructions.   Watch your condition.   Get help right away if you are not doing well or get worse.  Document Released: 07/20/2011 Document Reviewed: 07/18/2011 [employer] Patient Information 2012 [employer]. 

## 2012-01-05 NOTE — H&P (Signed)
Brooke Greene is an 36 y.o. 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  For laparoscopic oophorectomy  Patient's last menstrual period was 12/28/2011.    Past Medical History  Diagnosis Date  . High cholesterol   . Acid reflux     Past Surgical History  Procedure Date  . Hernia repair [id number]    Left and right  . Breast surgery 2002,2003    Cysts on left and right breasts    Family History  Problem Relation Age of Onset  . HIV Father     Social History:  reports that she has been smoking Cigarettes.  She has a 9 pack-year smoking history. She does not have any smokeless tobacco history on file. She reports that she drinks alcohol. She reports that she uses illicit drugs (Marijuana) about twice per week.  Allergies:  Allergies  Allergen Reactions  . Penicillins Rash    Prescriptions prior to admission  Medication Sig Dispense Refill  . HYDROcodone-acetaminophen (NORCO) 5-325 MG per tablet Take 1 tablet by mouth every 6 (six) hours as needed. pain      . loratadine (CLARITIN) 10 MG tablet Take 10 mg by mouth daily.      . mometasone (NASONEX) 50 MCG/ACT nasal spray Place 2 sprays into both nostrils daily.      . Multiple Vitamin (MULITIVITAMIN WITH MINERALS) TABS Take 1 tablet by mouth daily.      Marland Kitchen omeprazole (PRILOSEC) 20 MG capsule Take 20 mg by mouth daily.      . pravastatin (PRAVACHOL) 20 MG tablet Take 20 mg by mouth daily.        ROS  Review of Systems  Constitutional: Negative for fever, chills, weight loss, malaise/fatigue and diaphoresis.  HENT: Negative for hearing loss, ear pain, nosebleeds, congestion, sore throat, neck pain, tinnitus and ear discharge.   Eyes: Negative for blurred vision, double vision, photophobia, pain, discharge and redness.  Respiratory: Negative for cough, hemoptysis, sputum production, shortness of breath, wheezing and stridor.     Cardiovascular: Negative for chest pain, palpitations, orthopnea, claudication, leg swelling and PND.  Gastrointestinal: Positive for left sided abdominal pain. Negative for heartburn, nausea, vomiting, diarrhea, constipation, blood in stool and melena.  Genitourinary: Negative for dysuria, urgency, frequency, hematuria and flank pain.  Musculoskeletal: Negative for myalgias, back pain, joint pain and falls.  Skin: Negative for itching and rash.  Neurological: Negative for dizziness, tingling, tremors, sensory change, speech change, focal weakness, seizures, loss of consciousness, weakness and headaches.  Endo/Heme/Allergies: Negative for environmental allergies and polydipsia. Does not bruise/bleed easily.  Psychiatric/Behavioral: Negative for depression, suicidal ideas, hallucinations, memory loss and substance abuse. The patient is not nervous/anxious and does not have insomnia.      Blood pressure 127/93, temperature 97.7 F (36.5 C), temperature source Oral, resp. rate 14, last menstrual period 12/28/2011, SpO2 99.00%. Physical Exam Physical Exam  Vitals reviewed. Constitutional: She is oriented to person, place, and time. She appears well-developed and well-nourished.  HENT:  Head: Normocephalic and atraumatic.  Right Ear: External ear normal.  Left Ear: External ear normal.  Nose: Nose normal.  Mouth/Throat: Oropharynx is clear and moist.  Eyes: Conjunctivae and EOM are normal. Pupils are equal, round, and reactive to light. Right eye exhibits no discharge. Left eye exhibits no discharge. No scleral icterus.  Neck: Normal range of motion. Neck supple. No tracheal deviation present. No thyromegaly present.  Cardiovascular: Normal rate,  regular rhythm, normal heart sounds and intact distal pulses.  Exam reveals no gallop and no friction rub.   No murmur heard. Respiratory: Effort normal and breath sounds normal. No respiratory distress. She has no wheezes. She has no rales. She  exhibits no tenderness.  GI: Soft. Bowel sounds are normal. She exhibits no distension and no mass. There is tenderness. There is no rebound and no guarding.  Genitourinary:       Vulva is normal without lesions Vagina is pink moist without discharge Cervix normal in appearance and pap is normal Uterus is normal Adnexa is significant for left complex adnexal mass consistent with dermoid, right ovary is normal.  Musculoskeletal: Normal range of motion. She exhibits no edema and no tenderness.  Neurological: She is alert and oriented to person, place, and time. She has normal reflexes. She displays normal reflexes. No cranial nerve deficit. She exhibits normal muscle tone. Coordination normal.  Skin: Skin is warm and dry. No rash noted. No erythema. No pallor.  Psychiatric: She has a normal mood and affect. Her behavior is normal. Judgment and thought content normal.   Recent Results (from the past 336 hour(s))  SURGICAL PCR SCREEN   Collection Time   12/29/11  9:18 AM      Component Value Range   MRSA, PCR NEGATIVE  NEGATIVE    Staphylococcus aureus NEGATIVE  NEGATIVE   URINALYSIS, ROUTINE W REFLEX MICROSCOPIC   Collection Time   12/29/11  9:18 AM      Component Value Range   Color, Urine RED (*) YELLOW    APPearance CLEAR  CLEAR    Specific Gravity, Urine 1.030  1.005 - 1.030    pH 6.0  5.0 - 8.0    Glucose, UA NEGATIVE  NEGATIVE (mg/dL)   Hgb urine dipstick LARGE (*) NEGATIVE    Bilirubin Urine NEGATIVE  NEGATIVE    Ketones, ur NEGATIVE  NEGATIVE (mg/dL)   Protein, ur 30 (*) NEGATIVE (mg/dL)   Urobilinogen, UA 0.2  0.0 - 1.0 (mg/dL)   Nitrite NEGATIVE  NEGATIVE    Leukocytes, UA NEGATIVE  NEGATIVE   URINE MICROSCOPIC-ADD ON   Collection Time   12/29/11  9:18 AM      Component Value Range   RBC / HPF TOO NUMEROUS TO COUNT  <3 (RBC/hpf)  CBC   Collection Time   12/29/11  9:30 AM      Component Value Range   WBC 6.8  4.0 - 10.5 (K/uL)   RBC 3.84 (*) 3.87 - 5.11 (MIL/uL)    Hemoglobin 11.9 (*) 12.0 - 15.0 (g/dL)   HCT 21.3 (*) 08.6 - 46.0 (%)   MCV 91.9  78.0 - 100.0 (fL)   MCH 31.0  26.0 - 34.0 (pg)   MCHC 33.7  30.0 - 36.0 (g/dL)   RDW 57.8  46.9 - 62.9 (%)   Platelets 265  150 - 400 (K/uL)  COMPREHENSIVE METABOLIC PANEL   Collection Time   12/29/11  9:30 AM      Component Value Range   Sodium 141  135 - 145 (mEq/L)   Potassium 4.0  3.5 - 5.1 (mEq/L)   Chloride 104  96 - 112 (mEq/L)   CO2 28  19 - 32 (mEq/L)   Glucose, Bld 90  70 - 99 (mg/dL)   BUN 13  6 - 23 (mg/dL)   Creatinine, Ser 5.28  0.50 - 1.10 (mg/dL)   Calcium 41.3  8.4 - 10.5 (mg/dL)   Total Protein 6.9  6.0 - 8.3 (g/dL)   Albumin 3.9  3.5 - 5.2 (g/dL)   AST 23  0 - 37 (U/L)   ALT 14  0 - 35 (U/L)   Alkaline Phosphatase 53  39 - 117 (U/L)   Total Bilirubin 0.1 (*) 0.3 - 1.2 (mg/dL)   GFR calc non Af Amer >90  >90 (mL/min)   GFR calc Af Amer >90  >90 (mL/min)  HCG, QUANTITATIVE, PREGNANCY   Collection Time   12/29/11  9:30 AM      Component Value Range   hCG, Beta Chain, Quant, S <1  <5 (mIU/mL)         Assessment/Plan: 1.  Left ovarian benign cystic teratoma 2.  Increasing left pelvic pain secondary to #1  Patient understands the indication for removing her left ovary.  Pt understands the risks of surgery including but not limited t  excessive bleeding requiring transfusion or reoperation, post-operative infection requiring prolonged hospitalization or re-hospitalization and antibiotic therapy, and damage to other organs including bladder, bowel, ureters and major vessels.  The patient also understands the alternative treatment options which were discussed in full.  All questions were answered.   Brooke Greene 01/05/2012, 7:38 AM

## 2012-01-05 NOTE — Anesthesia Preprocedure Evaluation (Signed)
Anesthesia Evaluation  Patient identified by MRN, date of birth, ID band Patient awake    Reviewed: Allergy & Precautions, H&P , NPO status , Patient's Chart, lab work & pertinent test results  Airway Mallampati: II TM Distance: >3 FB Neck ROM: Full    Dental No notable dental hx.    Pulmonary Current Smoker,    Pulmonary exam normal       Cardiovascular negative cardio ROS  Rhythm:Regular Rate:Normal     Neuro/Psych negative neurological ROS  negative psych ROS   GI/Hepatic GERD-  Medicated and Controlled,(+)     substance abuse  marijuana use,   Endo/Other  negative endocrine ROS  Renal/GU negative Renal ROS     Musculoskeletal negative musculoskeletal ROS (+)   Abdominal Normal abdominal exam  (+)   Peds  Hematology  (+) Blood dyscrasia, anemia ,   Anesthesia Other Findings   Reproductive/Obstetrics negative OB ROS                           Anesthesia Physical Anesthesia Plan  ASA: II  Anesthesia Plan: General   Post-op Pain Management:    Induction: Intravenous, Rapid sequence and Cricoid pressure planned  Airway Management Planned: Oral ETT  Additional Equipment:   Intra-op Plan:   Post-operative Plan: Extubation in OR  Informed Consent: I have reviewed the patients History and Physical, chart, labs and discussed the procedure including the risks, benefits and alternatives for the proposed anesthesia with the patient or authorized representative who has indicated his/her understanding and acceptance.   Dental advisory given  Plan Discussed with: CRNA  Anesthesia Plan Comments:         Anesthesia Quick Evaluation

## 2012-11-28 ENCOUNTER — Encounter (HOSPITAL_COMMUNITY): Payer: Self-pay | Admitting: *Deleted

## 2012-11-28 ENCOUNTER — Emergency Department (HOSPITAL_COMMUNITY)
Admission: EM | Admit: 2012-11-28 | Discharge: 2012-11-28 | Disposition: A | Discharge status: Home / Self Care | Payer: BC Managed Care – PPO | Attending: Emergency Medicine | Admitting: Emergency Medicine

## 2012-11-28 DIAGNOSIS — Z79899 Other long term (current) drug therapy: Secondary | ICD-10-CM | POA: Insufficient documentation

## 2012-11-28 DIAGNOSIS — K625 Hemorrhage of anus and rectum: Secondary | ICD-10-CM | POA: Insufficient documentation

## 2012-11-28 DIAGNOSIS — K649 Unspecified hemorrhoids: Secondary | ICD-10-CM | POA: Insufficient documentation

## 2012-11-28 DIAGNOSIS — K219 Gastro-esophageal reflux disease without esophagitis: Secondary | ICD-10-CM | POA: Insufficient documentation

## 2012-11-28 DIAGNOSIS — F17200 Nicotine dependence, unspecified, uncomplicated: Secondary | ICD-10-CM | POA: Insufficient documentation

## 2012-11-28 DIAGNOSIS — Z862 Personal history of diseases of the blood and blood-forming organs and certain disorders involving the immune mechanism: Secondary | ICD-10-CM | POA: Insufficient documentation

## 2012-11-28 DIAGNOSIS — Z880 Allergy status to penicillin: Secondary | ICD-10-CM | POA: Insufficient documentation

## 2012-11-28 DIAGNOSIS — R5383 Other fatigue: Secondary | ICD-10-CM | POA: Insufficient documentation

## 2012-11-28 DIAGNOSIS — Z8639 Personal history of other endocrine, nutritional and metabolic disease: Secondary | ICD-10-CM | POA: Insufficient documentation

## 2012-11-28 DIAGNOSIS — R5381 Other malaise: Secondary | ICD-10-CM | POA: Insufficient documentation

## 2012-11-28 LAB — BASIC METABOLIC PANEL
Chloride: 99 mEq/L (ref 96–112)
GFR calc Af Amer: >90 mL/min (ref >90)
Potassium: 3.8 mEq/L (ref 3.5–5.1)
Sodium: 135 mEq/L (ref 135–145)

## 2012-11-28 LAB — CBC WITH DIFFERENTIAL/PLATELET
Basophils Absolute: 0 10*3/uL (ref 0.0–0.1)
Basophils Relative: 0 % (ref 0–1)
Hemoglobin: 11.6 g/dL — ABNORMAL LOW (ref 12.0–15.0)
MCHC: 33.2 g/dL (ref 30.0–36.0)
Monocytes Relative: 8 % (ref 3–12)
Neutro Abs: 4.8 10*3/uL (ref 1.7–7.7)
Neutrophils Relative %: 62 % (ref 43–77)
RDW: 14.3 % (ref 11.5–15.5)

## 2012-11-28 MED ORDER — HYDROCORTISONE ACETATE 25 MG RE SUPP: 25.0000 mg | Freq: Two times a day (BID) | RECTAL | Status: DC

## 2012-11-28 NOTE — ED Provider Notes (Signed)
History     CSN: [id number]  Arrival date & time 11/28/12  1559   First MD Initiated Contact with Patient 11/28/12 1908      No chief complaint on file.   (Consider location/radiation/quality/duration/timing/severity/associated sxs/prior treatment) HPI  Patient reports earlier this morning she had a bowel movement and she noticed there was some blood mixed in the stool and she saw blood on the toilet tissue. She states later on this afternoon about 2 PM she felt the urge to have another bowel movement and there was more blood and less stool that she passed. She denies any abdominal pain. She denies any nausea or vomiting. She states she feels weak but denies dizziness. She states she had similar symptoms about 4-5 months ago but not as bad. She was checked by her PCP was told that the stool test was negative for blood. She has never seen a gastroenterologist or had endoscopy or colonoscopy done.  PCP [facility]  Past Medical History  Diagnosis Date  . High cholesterol   . Acid reflux     Past Surgical History  Procedure Laterality Date  . Hernia repair  [id number]    Left and right  . Breast surgery  2002,2003    Cysts on left and right breasts  . Oophorectomy      Family History  Problem Relation Age of Onset  . HIV Father     History  Substance Use Topics  . Smoking status: Current Every Day Smoker -- 0.50 packs/day for 18 years    Types: Cigarettes  . Smokeless tobacco: Not on file  . Alcohol Use: Yes     Comment: occasional  employed  OB History   Grav Para Term Preterm Abortions TAB SAB Ect Mult Living                  Review of Systems  All other systems reviewed and are negative.    Allergies  Penicillins  Home Medications   Current Outpatient Rx  Name  Route  Sig  Dispense  Refill  . omeprazole (PRILOSEC) 20 MG capsule   Oral   Take 20 mg by mouth daily.           BP 122/83  Pulse 72  Temp(Src) 98.3 F (36.8 C) (Oral)  Resp 18   Ht 5\' 3"  (1.6 m)  Wt 100 lb (45.36 kg)  BMI 17.72 kg/m2  SpO2 100%  LMP 11/14/2012  Vital signs normal    orthostatic vital signs blood pressure remained unchanged, pulse increased from 64-72 on standing.   Physical Exam  Nursing note and vitals reviewed. Constitutional: She is oriented to person, place, and time. She appears well-developed and well-nourished.  Non-toxic appearance. She does not appear ill. No distress.  HENT:  Head: Normocephalic and atraumatic.  Right Ear: External ear normal.  Left Ear: External ear normal.  Nose: Nose normal. No mucosal edema or rhinorrhea.  Mouth/Throat: Oropharynx is clear and moist and mucous membranes are normal. No dental abscesses or edematous.  Eyes: Conjunctivae and EOM are normal. Pupils are equal, round, and reactive to light.  Neck: Normal range of motion and full passive range of motion without pain. Neck supple.  Cardiovascular: Normal rate, regular rhythm and normal heart sounds.  Exam reveals no gallop and no friction rub.   No murmur heard. Pulmonary/Chest: Effort normal and breath sounds normal. No respiratory distress. She has no wheezes. She has no rhonchi. She has no rales. She exhibits no  tenderness and no crepitus.  Abdominal: Soft. Normal appearance and bowel sounds are normal. She exhibits no distension. There is no tenderness. There is no rebound and no guarding.  Genitourinary:  Patient has small hemorrhoids present. There is no active bleeding or thrombosed hemorrhoids seen. Her vault was empty with small specks of yellow stool. Her Hemoccult was negative  Musculoskeletal: Normal range of motion. She exhibits no edema and no tenderness.  Moves all extremities well.   Neurological: She is alert and oriented to person, place, and time. She has normal strength. No cranial nerve deficit.  Skin: Skin is warm, dry and intact. No rash noted. No erythema. No pallor.  Psychiatric: She has a normal mood and affect. Her speech is  normal and behavior is normal. Her mood appears not anxious.    ED Course  Procedures (including critical care time)  Patient had no episodes of rectal bleeding while in the emergency department.  Results for orders placed during the hospital encounter of 11/28/12  CBC WITH DIFFERENTIAL      Result Value Range   WBC 7.7  4.0 - 10.5 K/uL   RBC 3.86 (*) 3.87 - 5.11 MIL/uL   Hemoglobin 11.6 (*) 12.0 - 15.0 g/dL   HCT 16.1 (*) 09.6 - 04.5 %   MCV 90.4  78.0 - 100.0 fL   MCH 30.1  26.0 - 34.0 pg   MCHC 33.2  30.0 - 36.0 g/dL   RDW 40.9  81.1 - 91.4 %   Platelets 268  150 - 400 K/uL   Neutrophils Relative 62  43 - 77 %   Neutro Abs 4.8  1.7 - 7.7 K/uL   Lymphocytes Relative 28  12 - 46 %   Lymphs Abs 2.1  0.7 - 4.0 K/uL   Monocytes Relative 8  3 - 12 %   Monocytes Absolute 0.6  0.1 - 1.0 K/uL   Eosinophils Relative 2  0 - 5 %   Eosinophils Absolute 0.1  0.0 - 0.7 K/uL   Basophils Relative 0  0 - 1 %   Basophils Absolute 0.0  0.0 - 0.1 K/uL  BASIC METABOLIC PANEL      Result Value Range   Sodium 135  135 - 145 mEq/L   Potassium 3.8  3.5 - 5.1 mEq/L   Chloride 99  96 - 112 mEq/L   CO2 26  19 - 32 mEq/L   Glucose, Bld 86  70 - 99 mg/dL   BUN 13  6 - 23 mg/dL   Creatinine, Ser 7.82  0.50 - 1.10 mg/dL   Calcium 9.6  8.4 - 95.6 mg/dL   GFR calc non Af Amer >90  >90 mL/min   GFR calc Af Amer >90  >90 mL/min   Laboratory interpretation all normal except mild anemia  Hemoccult was negative     1. Rectal bleeding    Discharge Medication List as of 11/28/2012  8:01 PM    START taking these medications   Details  hydrocortisone (ANUSOL-HC) 25 MG suppository Place 1 suppository (25 mg total) rectally 2 (two) times daily., Starting 11/28/2012, Until Discontinued, Print        Plan discharge     MDM patient had 2 episode of rectal bleeding today. Her labs are currently normal and her orthostatic vital signs are normal. Her rectal exam does not reveal active bleeding at this  time. Patient can be followed as an outpatient. She does have some small hemorrhoids present. I will treat her  with Anusol HC suppositories in case her bleeding is hemorrhoidal in origin.           Ward Givens, MD 11/28/12 2130

## 2012-11-28 NOTE — ED Notes (Signed)
Discharge instructions given and reviewed with patient.  Prescription given for Anusol suppositories; effects and use explained.  Patient verbalized understanding to use suppositories as directed and to follow up with Dr. Darrick Penna for further evaluation.  Patient ambulatory; discharged home in good condition.

## 2012-11-28 NOTE — ED Notes (Addendum)
2 BMs today with blood.No constipation . Says BRB.  Abd "feels funny".  No N/v  Pain lt flank

## 2014-01-07 IMAGING — US US PELVIS COMPLETE
1 series · 13 of 25 positions shown · non-contrast
Comparison: Pelvic CT same day.

CLINICAL DATA: 35-year-old with pelvic pain.  Question torsion.

TRANSABDOMINAL ULTRASOUND OF PELVIS
DOPPLER ULTRASOUND OF OVARIES
TECHNIQUE: Transabdominal ultrasound examination of the pelvis was
performed. Transabdominal technique was performed for global
imaging of the pelvis including uterus, ovaries, adnexal regions,
and pelvic cul-de-sac. The patient refused transvaginal imaging.
Color and duplex Doppler ultrasound was utilized to evaluate blood
flow to the ovaries.

[Series 1: us pelvis complete · 0.20mm/px · 13 of 39 slices shown]
[im 1/39]
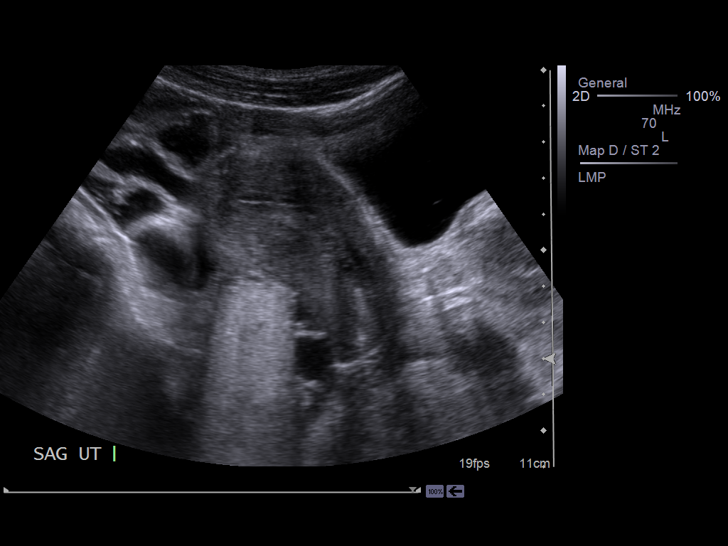
[im 4/39]
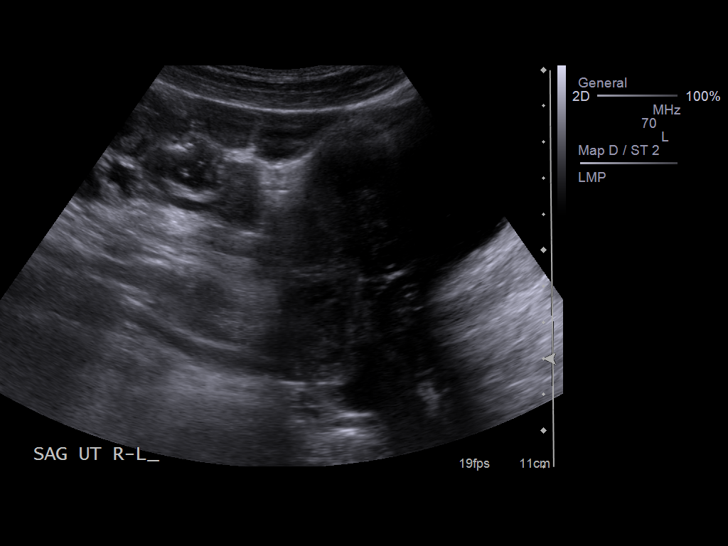
[im 7/39]
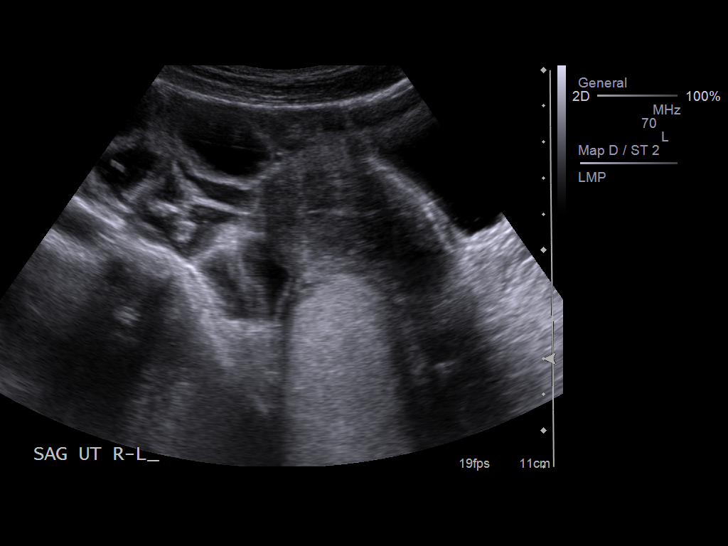
[im 10/39]
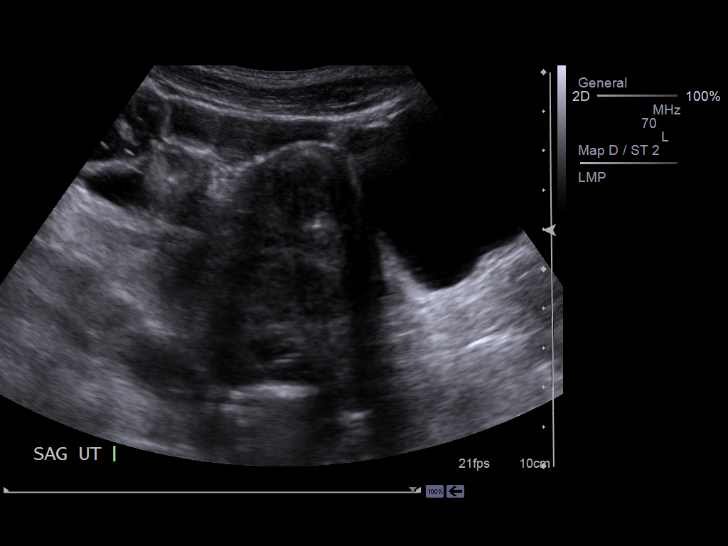
[im 13/39]
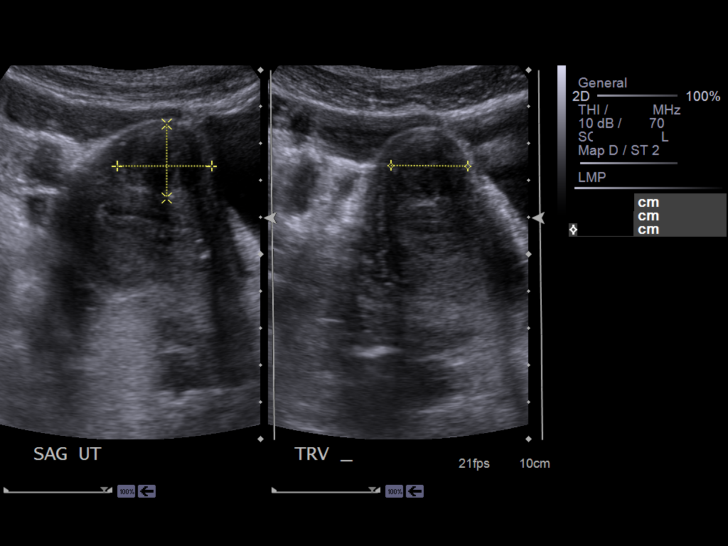
[im 16/39]
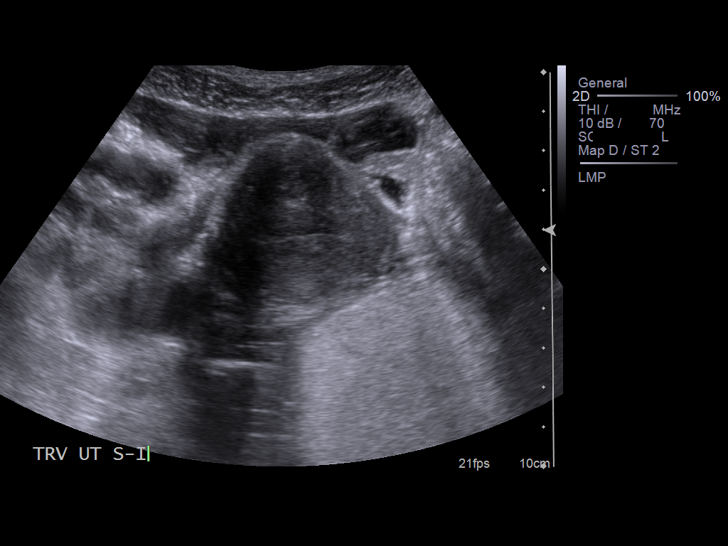
[im 20/39]
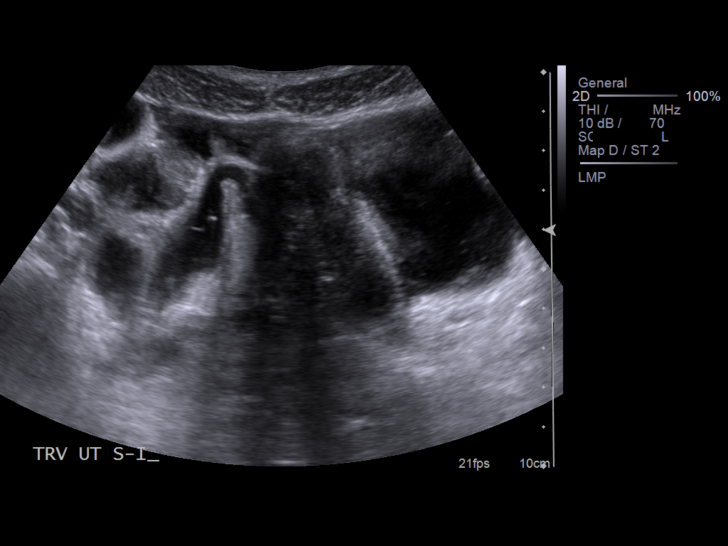
[im 23/39]
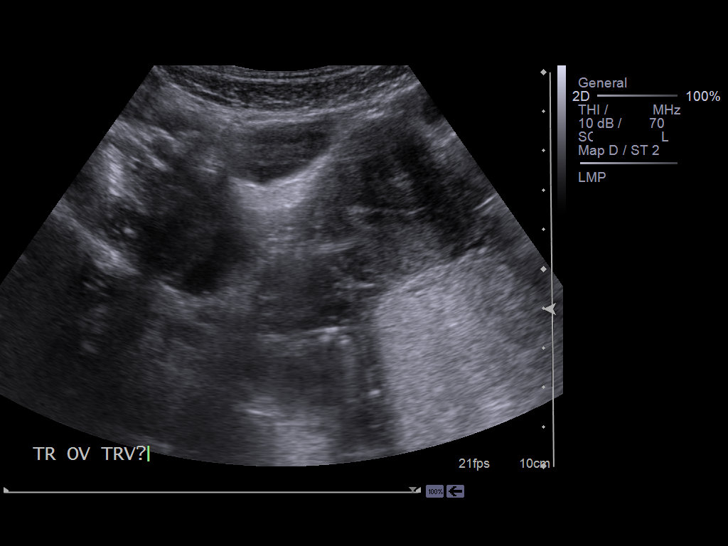
[im 26/39]
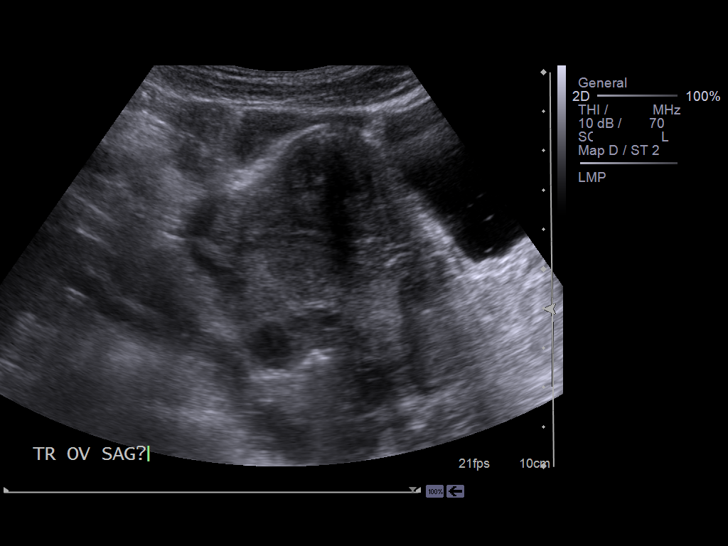
[im 29/39]
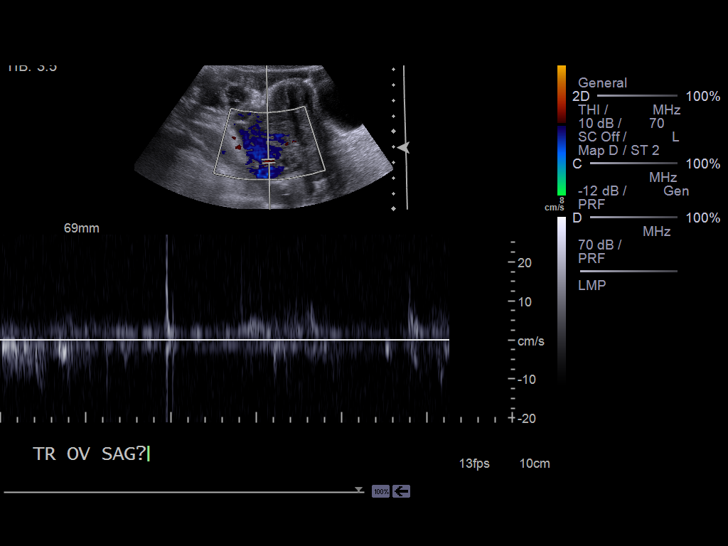
[im 32/39]
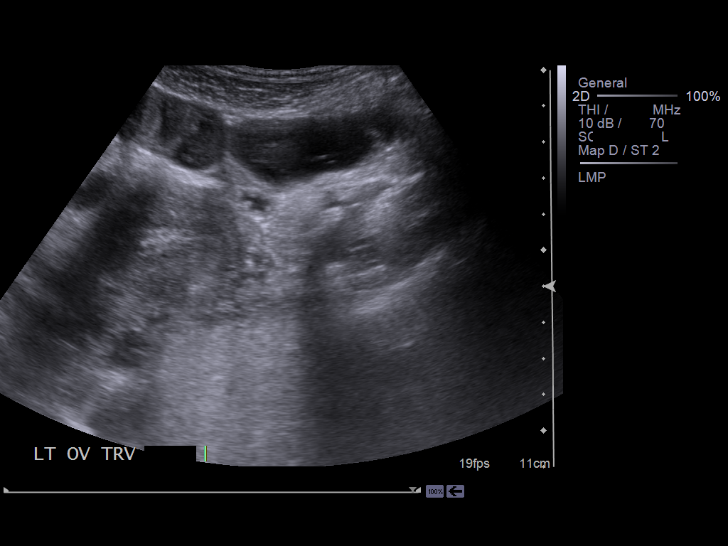
[im 35/39]
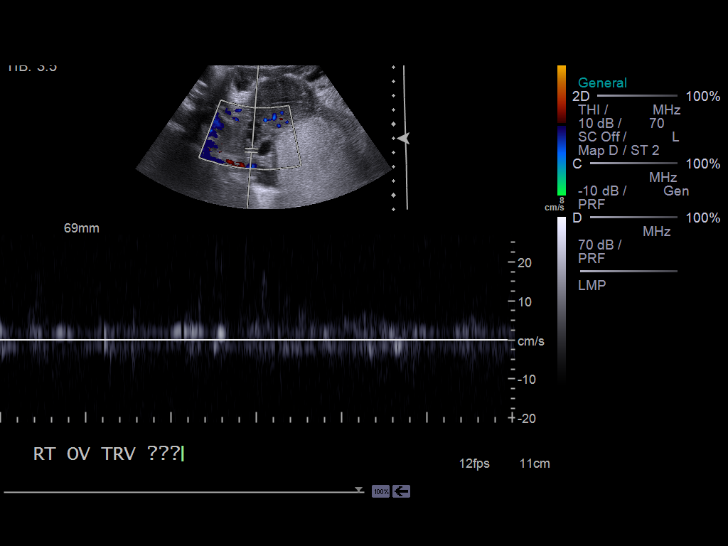
[im 39/39]
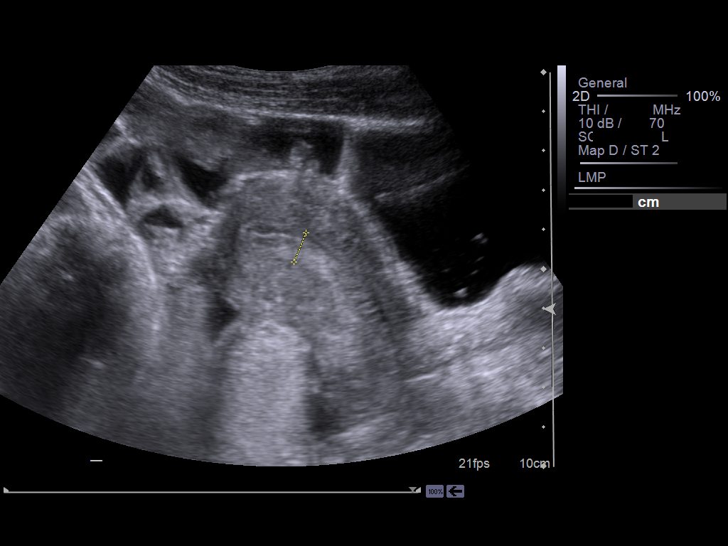

[13 of 25 positions shown; findings below may reference images not displayed]

FINDINGS: Uterus:  Measures 6.6 x 3.9 x 5.6 cm.  There is a fibroid
anteriorly which measures 2.6 x 2.0 x 2.1 cm.

Endometrium:  10 mm in thickness.

Right ovary: The ovaries were difficult to visualize
transabdominally.  What likely represents the right ovary based on
the prior CT measures 2.7 x 1.6 x 2.0 cm and demonstrates blood
flow with color Doppler.

Left ovary:   The left ovary was not clearly seen transabdominally.
Based on CT, the patient appears to have a large left ovarian
dermoid.

Pulsed Doppler evaluation was limited due to limited visualization
of the ovaries.
IMPRESSION: 1.  The ovaries are poorly visualized on the transabdominal
ultrasound.  The patient refused a transvaginal study.  Ovarian
torsion cannot be excluded by this examination.
2.  In conjunction with today's pelvic CT, there is evidence of a
large left ovarian dermoid.
3.  Uterine fibroid.

## 2014-01-07 IMAGING — CT CT ABD-PELV W/ CM
2 of 3 series · 16 of 46 positions shown, 18 images · IV contrast (Omnipaque 300)
Comparison: None.

CLINICAL DATA: Abdominal pain

CT ABDOMEN AND PELVIS WITH CONTRAST
TECHNIQUE: Multidetector CT imaging of the abdomen and pelvis was
performed following the standard protocol during bolus
administration of intravenous contrast.
Contrast: 100mL OMNIPAQUE IOHEXOL 300 MG/ML  SOLN

[Series 2: abd_pel_with 5.0 b40f · axial · 0.59mm/px · z∈[-462,-126]mm · 13 of 77 slices shown, 15 images]
[im 5/77  soft-tissue]
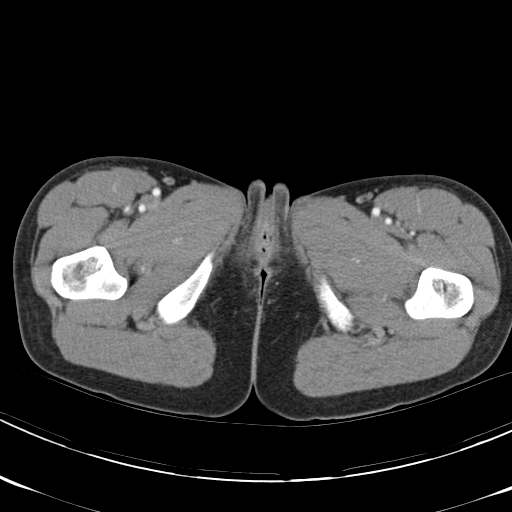
[im 5/77  bone]
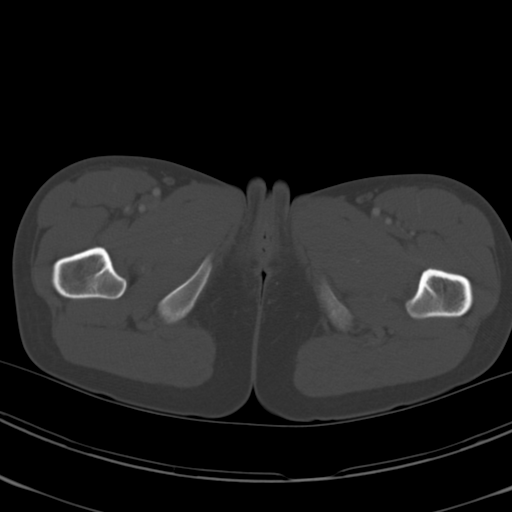
[im 10/77  soft-tissue]
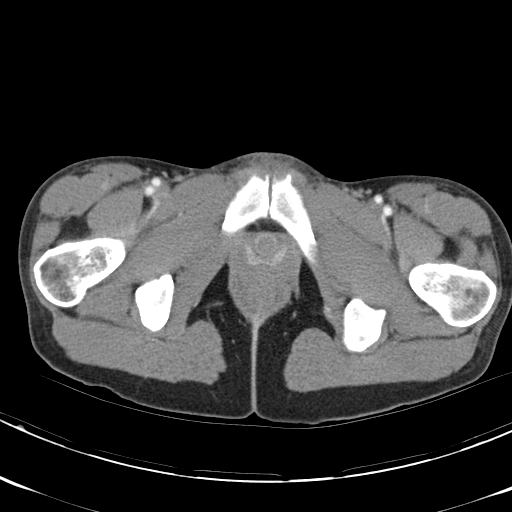
[im 15/77  soft-tissue]
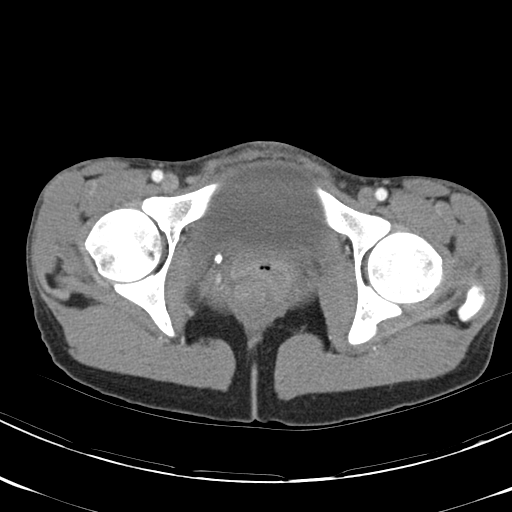
[im 23/77  soft-tissue]
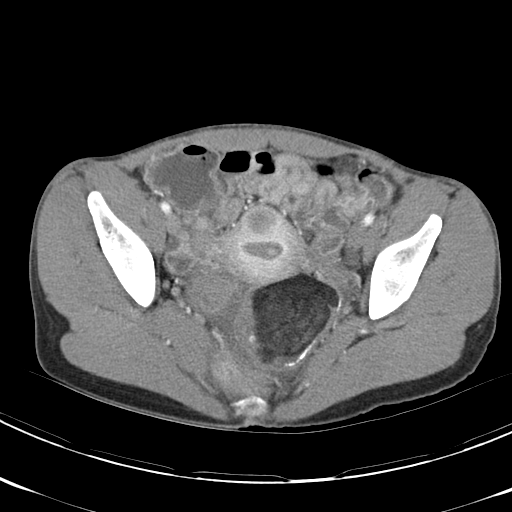
[im 27/77  soft-tissue]
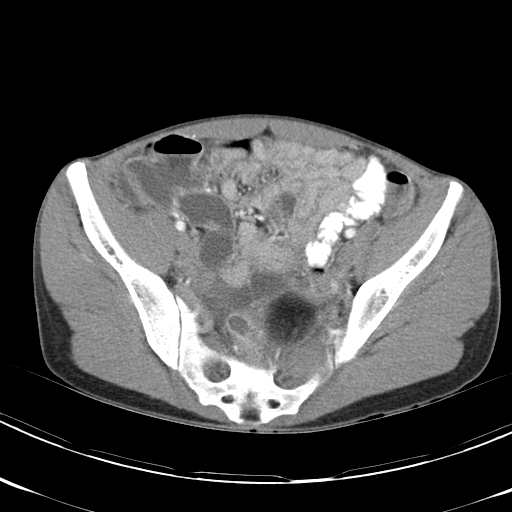
[im 32/77  soft-tissue]
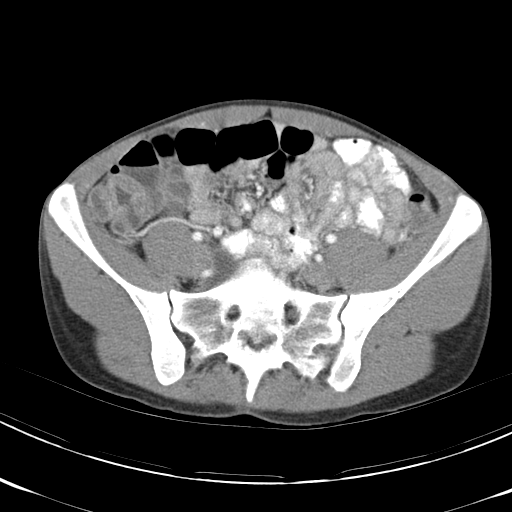
[im 40/77  soft-tissue]
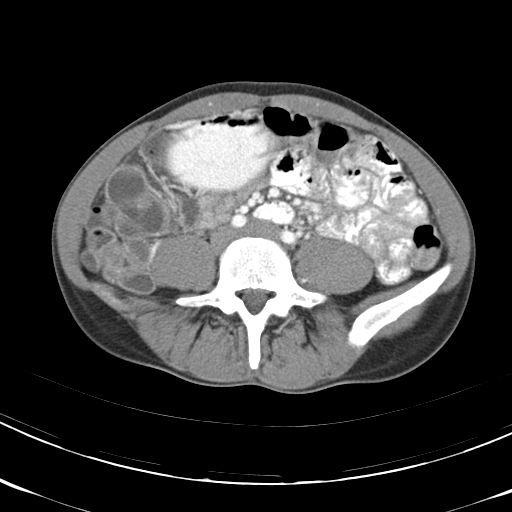
[im 45/77  soft-tissue]
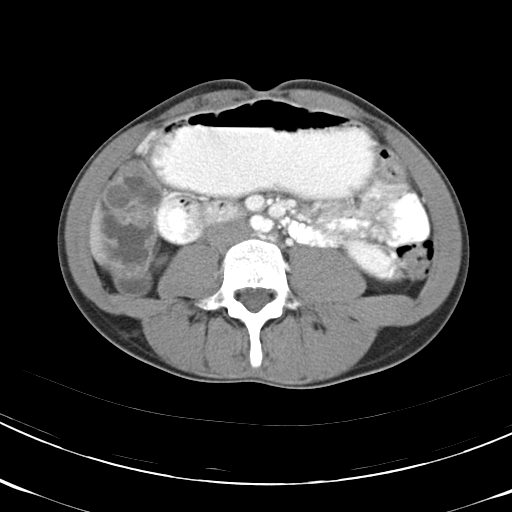
[im 50/77  soft-tissue]
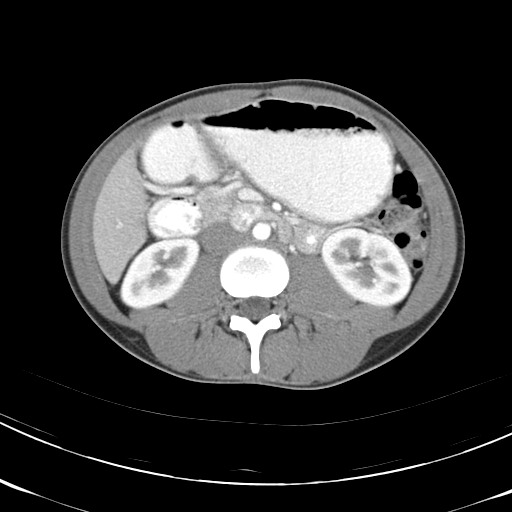
[im 50/77  bone]
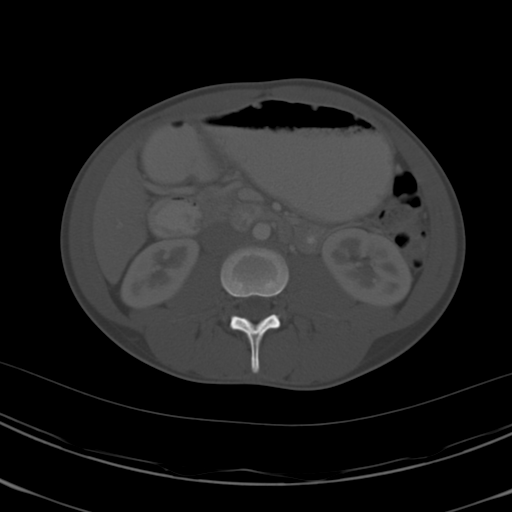
[im 54/77  soft-tissue]
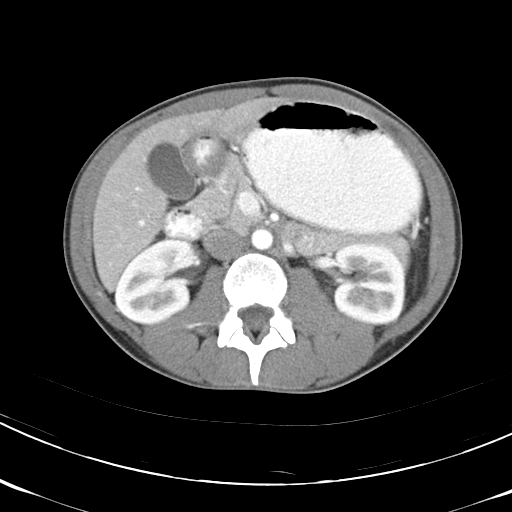
[im 62/77  soft-tissue]
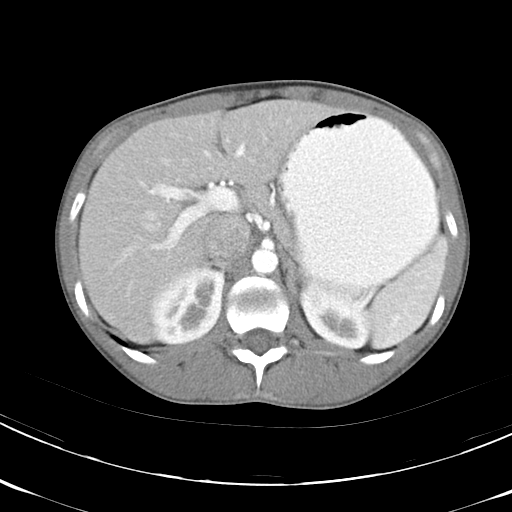
[im 67/77  soft-tissue]
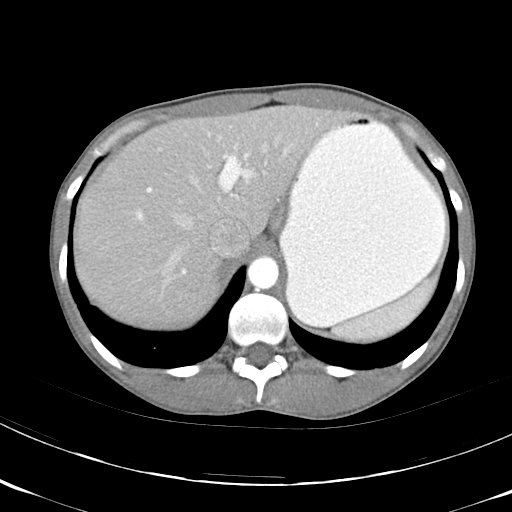
[im 72/77  soft-tissue]
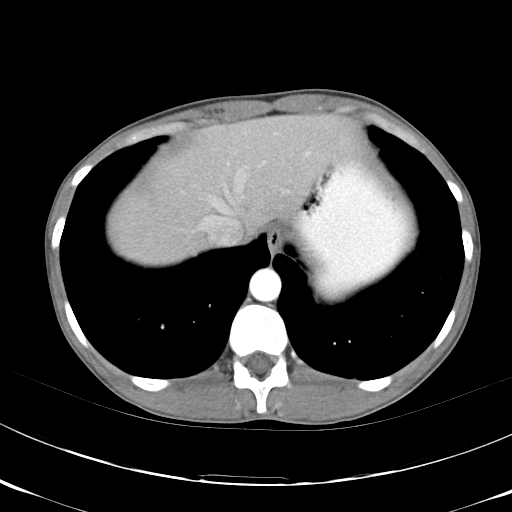

[Series 4: abd_pel_with 3.0 spo cor · coronal · 0.59mm/px · 3 of 65 slices shown]
[im 22/65  soft-tissue]
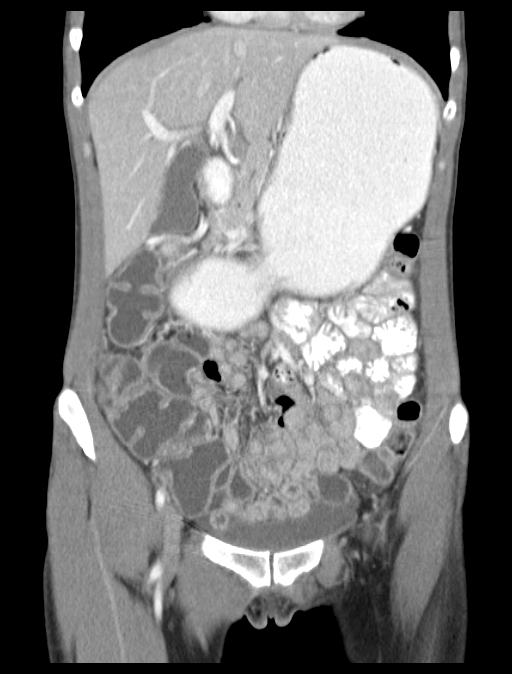
[im 29/65  soft-tissue]
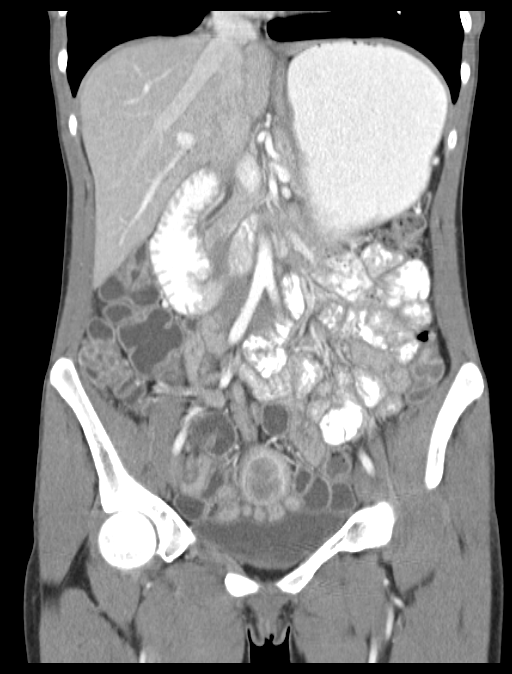
[im 36/65  soft-tissue]
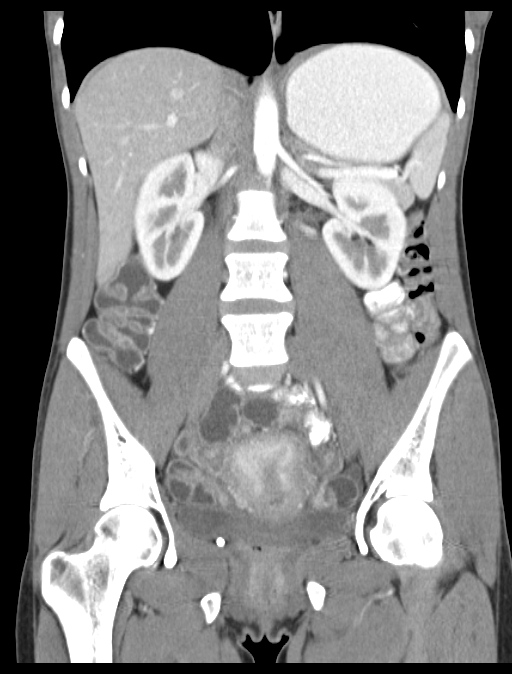

[16 of 46 positions shown; findings below may reference images not displayed]

FINDINGS: There is mild plate-like atelectasis in the lung bases.

There is no focal liver abnormality.

The spleen appears normal.

The adrenal glands are normal.

The normal appearance of the right kidney.  The left kidney is also
normal.

No upper abdominal adenopathy identified.

There is no pelvic or inguinal adenopathy.

The urinary bladder appears normal.  There is a low density and
slightly heterogeneous mass within the anterior myometrium which
measures 2.3 x 2.0 cm.

There is slightly heterogeneous fluid distention of the uterine
cavity.

Posterior to the uterus is a large, complex mass which measures
x 6.9 cm.  This has osseous and fat elements and is most compatible
with a dermoid.

Small amount of free fluid is identified within the pelvis.

The stomach and the small bowel loops are normal.

No obstruction.

The appendix is difficult to visualize separate from the right
lower quadrant bowel loops.

Review of the visualized osseous structures is unremarkable.
IMPRESSION: 1.  Large complex mass within the left posterior pelvis has imaging
characteristics compatible with ovarian dermoid.  This may
predispose the patient to ovarian torsion.  If the patient is
currently symptomatic I would advise further evaluation with pelvic
sonogram and ovarian Doppler.
2.  Low density mass arising from the ventral wall of the uterus.
This likely represents a degenerating fibroid.  This would be
better assessed with pelvic sonogram.
3.  Fluid distention of the uterine cavity which appears slightly
heterogeneous.  Attention on this area on pelvic sonogram advised.

## 2014-03-31 ENCOUNTER — Emergency Department (HOSPITAL_COMMUNITY)
Admission: EM | Admit: 2014-03-31 | Discharge: 2014-03-31 | Disposition: A | Discharge status: Home / Self Care | Payer: BC Managed Care – PPO | Attending: Emergency Medicine | Admitting: Emergency Medicine

## 2014-03-31 ENCOUNTER — Emergency Department (HOSPITAL_COMMUNITY): Payer: BC Managed Care – PPO

## 2014-03-31 ENCOUNTER — Encounter (HOSPITAL_COMMUNITY): Payer: Self-pay | Admitting: Emergency Medicine

## 2014-03-31 DIAGNOSIS — Z8719 Personal history of other diseases of the digestive system: Secondary | ICD-10-CM | POA: Diagnosis not present

## 2014-03-31 DIAGNOSIS — R0602 Shortness of breath: Secondary | ICD-10-CM | POA: Insufficient documentation

## 2014-03-31 DIAGNOSIS — N3000 Acute cystitis without hematuria: Secondary | ICD-10-CM | POA: Insufficient documentation

## 2014-03-31 DIAGNOSIS — R197 Diarrhea, unspecified: Secondary | ICD-10-CM | POA: Insufficient documentation

## 2014-03-31 DIAGNOSIS — F17200 Nicotine dependence, unspecified, uncomplicated: Secondary | ICD-10-CM | POA: Diagnosis not present

## 2014-03-31 DIAGNOSIS — Z791 Long term (current) use of non-steroidal anti-inflammatories (NSAID): Secondary | ICD-10-CM | POA: Diagnosis not present

## 2014-03-31 DIAGNOSIS — R079 Chest pain, unspecified: Secondary | ICD-10-CM | POA: Insufficient documentation

## 2014-03-31 DIAGNOSIS — Z862 Personal history of diseases of the blood and blood-forming organs and certain disorders involving the immune mechanism: Secondary | ICD-10-CM | POA: Insufficient documentation

## 2014-03-31 DIAGNOSIS — Z880 Allergy status to penicillin: Secondary | ICD-10-CM | POA: Insufficient documentation

## 2014-03-31 DIAGNOSIS — R112 Nausea with vomiting, unspecified: Secondary | ICD-10-CM | POA: Diagnosis not present

## 2014-03-31 DIAGNOSIS — Z3202 Encounter for pregnancy test, result negative: Secondary | ICD-10-CM | POA: Insufficient documentation

## 2014-03-31 DIAGNOSIS — R0789 Other chest pain: Secondary | ICD-10-CM | POA: Diagnosis not present

## 2014-03-31 DIAGNOSIS — Z8639 Personal history of other endocrine, nutritional and metabolic disease: Secondary | ICD-10-CM | POA: Insufficient documentation

## 2014-03-31 LAB — BASIC METABOLIC PANEL
Anion gap: 10 (ref 5–15)
BUN: 8 mg/dL (ref 6–23)
CHLORIDE: 108 meq/L (ref 96–112)
CO2: 24 mEq/L (ref 19–32)
CREATININE: 0.57 mg/dL (ref 0.50–1.10)
Calcium: 8.3 mg/dL — ABNORMAL LOW (ref 8.4–10.5)
GFR calc Af Amer: >90 mL/min (ref >90)
GLUCOSE: 70 mg/dL (ref 70–99)
POTASSIUM: 4.2 meq/L (ref 3.7–5.3)
Sodium: 142 mEq/L (ref 137–147)

## 2014-03-31 LAB — URINALYSIS, ROUTINE W REFLEX MICROSCOPIC
BILIRUBIN URINE: NEGATIVE
Glucose, UA: NEGATIVE mg/dL
NITRITE: NEGATIVE
PH: 8.5 — AB (ref 5.0–8.0)
Specific Gravity, Urine: 1.015 (ref 1.005–1.030)
Urobilinogen, UA: 0.2 mg/dL (ref 0.0–1.0)

## 2014-03-31 LAB — PREGNANCY, URINE: Preg Test, Ur: NEGATIVE

## 2014-03-31 LAB — URINE MICROSCOPIC-ADD ON

## 2014-03-31 LAB — D-DIMER, QUANTITATIVE (NOT AT ARMC): D DIMER QUANT: 0.36 ug{FEU}/mL (ref 0.00–0.48)

## 2014-03-31 LAB — TROPONIN I: Troponin I: <0.3 ng/mL (ref <0.30)

## 2014-03-31 MED ORDER — NITROFURANTOIN MONOHYD MACRO 100 MG PO CAPS: 100.0000 mg | ORAL_CAPSULE | Freq: Two times a day (BID) | ORAL | Status: DC

## 2014-03-31 MED ORDER — ONDANSETRON HCL 4 MG/2ML IJ SOLN
4.0000 mg | Freq: Once | INTRAMUSCULAR | Status: DC
  Filled 2014-03-31: qty 2

## 2014-03-31 MED ORDER — ONDANSETRON HCL 4 MG PO TABS: 4.0000 mg | ORAL_TABLET | Freq: Three times a day (TID) | ORAL | Status: DC | PRN

## 2014-03-31 MED ORDER — HYDROCODONE-ACETAMINOPHEN 5-325 MG PO TABS: 1.0000 | ORAL_TABLET | Freq: Four times a day (QID) | ORAL | Status: DC | PRN

## 2014-03-31 MED ORDER — KETOROLAC TROMETHAMINE 30 MG/ML IJ SOLN
30.0000 mg | Freq: Once | INTRAMUSCULAR | Status: AC
  Administered 2014-03-31: 30 mg via INTRAVENOUS
  Filled 2014-03-31: qty 1

## 2014-03-31 MED ORDER — ONDANSETRON HCL 4 MG/2ML IJ SOLN
4.0000 mg | Freq: Once | INTRAMUSCULAR | Status: AC
  Administered 2014-03-31: 4 mg via INTRAVENOUS

## 2014-03-31 NOTE — Discharge Instructions (Signed)
Chest Wall Pain °Chest wall pain is pain in or around the bones and muscles of your chest. It may take up to 6 weeks to get better. It may take longer if you must stay physically active in your work and activities.  °CAUSES  °Chest wall pain may happen on its own. However, it may be caused by: °· A viral illness like the flu. °· Injury. °· Coughing. °· Exercise. °· Arthritis. °· Fibromyalgia. °· Shingles. °HOME CARE INSTRUCTIONS  °· Avoid overtiring physical activity. Try not to strain or perform activities that cause pain. This includes any activities using your chest or your abdominal and side muscles, especially if heavy weights are used. °· Put ice on the sore area. °¨ Put ice in a plastic bag. °¨ Place a towel between your skin and the bag. °¨ Leave the ice on for 15-20 minutes per hour while awake for the first 2 days. °· Only take over-the-counter or prescription medicines for pain, discomfort, or fever as directed by your caregiver. °SEEK IMMEDIATE MEDICAL CARE IF:  °· Your pain increases, or you are very uncomfortable. °· You have a fever. °· Your chest pain becomes worse. °· You have new, unexplained symptoms. °· You have nausea or vomiting. °· You feel sweaty or lightheaded. °· You have a cough with phlegm (sputum), or you cough up blood. °MAKE SURE YOU:  °· Understand these instructions. °· Will watch your condition. °· Will get help right away if you are not doing well or get worse. °Document Released: 07/31/2005 Document Revised: 10/23/2011 Document Reviewed: 03/27/2011 °ExitCare® Patient Information ©2015 [employer]. This information is not intended to replace advice given to you by your health care provider. Make sure you discuss any questions you have with your health care provider. °Urinary Tract Infection °Urinary tract infections (UTIs) can develop anywhere along your urinary tract. Your urinary tract is your body's drainage system for removing wastes and extra water. Your urinary tract  includes two kidneys, two ureters, a bladder, and a urethra. Your kidneys are a pair of bean-shaped organs. Each kidney is about the size of your fist. They are located below your ribs, one on each side of your spine. °CAUSES °Infections are caused by microbes, which are microscopic organisms, including fungi, viruses, and bacteria. These organisms are so small that they can only be seen through a microscope. Bacteria are the microbes that most commonly cause UTIs. °SYMPTOMS  °Symptoms of UTIs may vary by age and gender of the patient and by the location of the infection. Symptoms in young women typically include a frequent and intense urge to urinate and a painful, burning feeling in the bladder or urethra during urination. Older women and men are more likely to be tired, shaky, and weak and have muscle aches and abdominal pain. A fever may mean the infection is in your kidneys. Other symptoms of a kidney infection include pain in your back or sides below the ribs, nausea, and vomiting. °DIAGNOSIS °To diagnose a UTI, your caregiver will ask you about your symptoms. Your caregiver also will ask to provide a urine sample. The urine sample will be tested for bacteria and white blood cells. White blood cells are made by your body to help fight infection. °TREATMENT  °Typically, UTIs can be treated with medication. Because most UTIs are caused by a bacterial infection, they usually can be treated with the use of antibiotics. The choice of antibiotic and length of treatment depend on your symptoms and the type of   bacteria causing your infection. °HOME CARE INSTRUCTIONS °· If you were prescribed antibiotics, take them exactly as your caregiver instructs you. Finish the medication even if you feel better after you have only taken some of the medication. °· Drink enough water and fluids to keep your urine clear or pale yellow. °· Avoid caffeine, tea, and carbonated beverages. They tend to irritate your bladder. °· Empty  your bladder often. Avoid holding urine for long periods of time. °· Empty your bladder before and after sexual intercourse. °· After a bowel movement, women should cleanse from front to back. Use each tissue only once. °SEEK MEDICAL CARE IF:  °· You have back pain. °· You develop a fever. °· Your symptoms do not begin to resolve within 3 days. °SEEK IMMEDIATE MEDICAL CARE IF:  °· You have severe back pain or lower abdominal pain. °· You develop chills. °· You have nausea or vomiting. °· You have continued burning or discomfort with urination. °MAKE SURE YOU:  °· Understand these instructions. °· Will watch your condition. °· Will get help right away if you are not doing well or get worse. °Document Released: 05/10/2005 Document Revised: 01/30/2012 Document Reviewed: 09/08/2011 °ExitCare® Patient Information ©2015 [employer]. This information is not intended to replace advice given to you by your health care provider. Make sure you discuss any questions you have with your health care provider. ° °

## 2014-03-31 NOTE — ED Notes (Signed)
Complain of chest pan that started around 0300 this morning. States pain is worse with movement. Also, complain of N/V/D. Given ASA prior to arrival

## 2014-03-31 NOTE — ED Provider Notes (Signed)
CSN: [id number]     Arrival date & time 03/31/14  6644 History  This chart was scribed for Merryl Hacker, MD by Ludger Nutting, ED Scribe. This patient was seen in room APA01/APA01 and the patient's care was started 9:04 AM.    Chief Complaint  Patient presents with  . Chest Pain    The history is provided by the patient. No language interpreter was used.    HPI Comments: NAARAH BORGERDING is a 38 y.o. female who presents to the Emergency Department complaining of constant central chest pain with associated SOB that began 6-7 hours ago. Patient describes her pain as sharp in nature and rates it as a  4/10 currently. She states the pain is worse with deep breathing. She has not taken any OTC medications for her symptoms. She denies similar symptoms in the past. She denies history of PE/DVT, recent long distance travel, birth control use. She denies cocaine use. She denies fever. She also reports onset of vomiting and diarrhea this morning. No abdominal pain. Emesis has been nonbilious and nonbloody.   Past Medical History  Diagnosis Date  . High cholesterol   . Acid reflux    Past Surgical History  Procedure Laterality Date  . Hernia repair  [id number]    Left and right  . Breast surgery  2002,2003    Cysts on left and right breasts  . Oophorectomy     Family History  Problem Relation Age of Onset  . HIV Father    History  Substance Use Topics  . Smoking status: Current Every Day Smoker -- 0.50 packs/day for 18 years    Types: Cigarettes  . Smokeless tobacco: Not on file  . Alcohol Use: Yes     Comment: occasional   OB History   Grav Para Term Preterm Abortions TAB SAB Ect Mult Living                 Review of Systems  Constitutional: Negative for fever.  Respiratory: Positive for chest tightness and shortness of breath. Negative for cough.   Cardiovascular: Positive for chest pain. Negative for palpitations and leg swelling.  Gastrointestinal: Positive for nausea,  vomiting and diarrhea. Negative for abdominal pain.  Genitourinary: Negative for dysuria.  Musculoskeletal: Negative for back pain.  Skin: Negative for wound.  Neurological: Negative for headaches.  All other systems reviewed and are negative.     Allergies  Penicillins  Home Medications   Prior to Admission medications   Medication Sig Start Date End Date Taking? Authorizing Provider  ibuprofen (ADVIL,MOTRIN) 200 MG tablet Take 400 mg by mouth every 8 (eight) hours as needed for moderate pain.   Yes Historical Provider, MD  HYDROcodone-acetaminophen (NORCO/VICODIN) 5-325 MG per tablet Take 1 tablet by mouth every 6 (six) hours as needed for moderate pain or severe pain. 03/31/14   Merryl Hacker, MD  nitrofurantoin, macrocrystal-monohydrate, (MACROBID) 100 MG capsule Take 1 capsule (100 mg total) by mouth 2 (two) times daily. 03/31/14   Merryl Hacker, MD  ondansetron (ZOFRAN) 4 MG tablet Take 1 tablet (4 mg total) by mouth every 8 (eight) hours as needed for nausea or vomiting. 03/31/14   Merryl Hacker, MD   LMP 03/29/2014 Physical Exam  Nursing note and vitals reviewed. Constitutional: She is oriented to person, place, and time. She appears well-developed and well-nourished. No distress.  thin  HENT:  Head: Normocephalic and atraumatic.  Neck: Neck supple.  Cardiovascular: Normal rate, regular rhythm and  normal heart sounds.   Pulmonary/Chest: Effort normal and breath sounds normal. No respiratory distress. She has no wheezes. She exhibits tenderness.  Tenderness palpation of anterior chest wall that reproduces the pain  Abdominal: Soft. Bowel sounds are normal. There is no tenderness. There is no rebound.  Musculoskeletal: She exhibits no edema.  Neurological: She is alert and oriented to person, place, and time.  Skin: Skin is warm and dry.  Psychiatric: She has a normal mood and affect.    ED Course  Procedures (including critical care time)  DIAGNOSTIC  STUDIES:   COORDINATION OF CARE: 9:22 AM Discussed treatment plan with pt at bedside and pt agreed to plan.   Labs Review Labs Reviewed  URINALYSIS, ROUTINE W REFLEX MICROSCOPIC - Abnormal; Notable for the following:    pH 8.5 (*)    Hgb urine dipstick LARGE (*)    Ketones, ur TRACE (*)    Protein, ur TRACE (*)    Leukocytes, UA TRACE (*)    All other components within normal limits  BASIC METABOLIC PANEL - Abnormal; Notable for the following:    Calcium 8.3 (*)    All other components within normal limits  URINE MICROSCOPIC-ADD ON - Abnormal; Notable for the following:    Bacteria, UA MANY (*)    All other components within normal limits  URINE CULTURE  TROPONIN I  D-DIMER, QUANTITATIVE  PREGNANCY, URINE    Imaging Review Dg Chest 2 View  03/31/2014   CLINICAL DATA:  Chest pain, smoker  EXAM: CHEST  2 VIEW  COMPARISON:  02/11/2010  FINDINGS: The heart size and mediastinal contours are within normal limits. Both lungs are clear. The visualized skeletal structures are unremarkable.  IMPRESSION: No active cardiopulmonary disease.   Electronically Signed   By: Daryll Brod M.D.   On: 03/31/2014 10:06     EKG Interpretation   Date/Time:  Tuesday March 31 2014 08:54:38 EDT Ventricular Rate:  82 PR Interval:  126 QRS Duration: 78 QT Interval:  361 QTC Calculation: 422 R Axis:   62 Text Interpretation:  Sinus rhythm Abnormal R-wave progression, early  transition Consider left ventricular hypertrophy No prior for comparison  Confirmed by Thaer Miyoshi  MD, Holston Oyama ([id number]) on 03/31/2014 8:56:47 AM      MDM   Final diagnoses:  Musculoskeletal chest pain  Acute cystitis without hematuria    Patient presents with chest pain. Pain is reproducible on exam. Low risk for PE. EKG is reassuring and chest x-ray shows no evidence of pneumothorax. Patient is otherwise well-appearing and appears hydrated. Screening BMP and urinalysis obtained to assess hydration status.  BMP reassuring.  Urinalysis shows evidence of possible UTI. Troponin and d-dimer are negative. Suspect chest wall pain which may be secondary to vomiting. Discussed the patient's anti-inflammatories and pain medication. Patient will be given Zofran. She will also be given a course of Macrobid for possible UTI. Patient has a penicillin allergy.  After history, exam, and medical workup I feel the patient has been appropriately medically screened and is safe for discharge home. Pertinent diagnoses were discussed with the patient. Patient was given return precautions.  I personally performed the services described in this documentation, which was scribed in my presence. The recorded information has been reviewed and is accurate.   Merryl Hacker, MD 03/31/14 1116

## 2014-04-02 LAB — URINE CULTURE

## 2017-08-02 ENCOUNTER — Encounter: Payer: Self-pay | Admitting: Internal Medicine

## 2017-09-26 ENCOUNTER — Other Ambulatory Visit: Payer: Self-pay | Admitting: *Deleted

## 2017-09-26 ENCOUNTER — Encounter: Payer: Self-pay | Admitting: Nurse Practitioner

## 2017-09-26 ENCOUNTER — Encounter: Payer: Self-pay | Admitting: *Deleted

## 2017-09-26 ENCOUNTER — Telehealth: Payer: Self-pay | Admitting: *Deleted

## 2017-09-26 ENCOUNTER — Ambulatory Visit: Payer: Managed Care, Other (non HMO) | Admitting: Nurse Practitioner

## 2017-09-26 DIAGNOSIS — K921 Melena: Secondary | ICD-10-CM | POA: Diagnosis not present

## 2017-09-26 DIAGNOSIS — R634 Abnormal weight loss: Secondary | ICD-10-CM

## 2017-09-26 DIAGNOSIS — K219 Gastro-esophageal reflux disease without esophagitis: Secondary | ICD-10-CM | POA: Diagnosis not present

## 2017-09-26 LAB — COMPREHENSIVE METABOLIC PANEL
AG Ratio: 1.7 (calc) (ref 1.0–2.5)
ALBUMIN MSPROF: 4.7 g/dL (ref 3.6–5.1)
ALKALINE PHOSPHATASE (APISO): 74 U/L (ref 33–115)
ALT: 18 U/L (ref 6–29)
AST: 23 U/L (ref 10–30)
BILIRUBIN TOTAL: 0.5 mg/dL (ref 0.2–1.2)
BUN/Creatinine Ratio: 8 (calc) (ref 6–22)
BUN: 6 mg/dL — ABNORMAL LOW (ref 7–25)
CALCIUM: 10.2 mg/dL (ref 8.6–10.2)
CHLORIDE: 104 mmol/L (ref 98–110)
CO2: 27 mmol/L (ref 20–32)
Creat: 0.75 mg/dL (ref 0.50–1.10)
GLOBULIN: 2.7 g/dL (ref 1.9–3.7)
Glucose, Bld: 106 mg/dL — ABNORMAL HIGH (ref 65–99)
POTASSIUM: 3.9 mmol/L (ref 3.5–5.3)
Sodium: 141 mmol/L (ref 135–146)
Total Protein: 7.4 g/dL (ref 6.1–8.1)

## 2017-09-26 LAB — CBC WITH DIFFERENTIAL/PLATELET
BASOS PCT: 0.8 %
Basophils Absolute: 50 cells/uL (ref 0–200)
EOS ABS: 37 {cells}/uL (ref 15–500)
Eosinophils Relative: 0.6 %
HEMATOCRIT: 41.6 % (ref 35.0–45.0)
Hemoglobin: 13.9 g/dL (ref 11.7–15.5)
LYMPHS ABS: 1451 {cells}/uL (ref 850–3900)
MCH: 30.4 pg (ref 27.0–33.0)
MCHC: 33.4 g/dL (ref 32.0–36.0)
MCV: 91 fL (ref 80.0–100.0)
MPV: 9.8 fL (ref 7.5–12.5)
Monocytes Relative: 10.2 %
NEUTROS PCT: 65 %
Neutro Abs: 4030 cells/uL (ref 1500–7800)
Platelets: 295 10*3/uL (ref 140–400)
RBC: 4.57 10*6/uL (ref 3.80–5.10)
RDW: 13.5 % (ref 11.0–15.0)
Total Lymphocyte: 23.4 %
WBC: 6.2 10*3/uL (ref 3.8–10.8)
WBCMIX: 632 {cells}/uL (ref 200–950)

## 2017-09-26 MED ORDER — DEXLANSOPRAZOLE 60 MG PO CPDR: 60.0000 mg | DELAYED_RELEASE_CAPSULE | Freq: Every day | ORAL | 3 refills | Status: DC

## 2017-09-26 MED ORDER — NA SULFATE-K SULFATE-MG SULF 17.5-3.13-1.6 GM/177ML PO SOLN: 1.0000 | ORAL | 0 refills | Status: DC

## 2017-09-26 NOTE — Assessment & Plan Note (Signed)
Patient has a history of intermittent weight loss.  She was actually started on Megace in June of last year at which point she put on 15 pounds.  Since this is discontinued in November she has been averaging 3-5 pounds of unintentional weight loss per month.  She is a tiny frame female to begin with and does not appear to have much weight to lose.  Given her persistent weight loss, hematochezia, persistent GERD symptoms despite PPI we will plan for endoscopic evaluation at this time.  Follow-up in 3 months.  Proceed with TCS and EGD on propofol/MAC with Dr. Gala Romney in near future: the risks, benefits, and alternatives have been discussed with the patient in detail. The patient states understanding and desires to proceed.  Patient is not on any anticoagulants, anxiolytics, chronic pain medications, or antidepressants.  She uses marijuana daily as an appetite stimulant.  Occasional/social alcohol use.  Given her young age and regular alcohol we will plan for the procedure on propofol/MAC to promote adequate sedation.

## 2017-09-26 NOTE — Progress Notes (Signed)
CC'D TO PCP °

## 2017-09-26 NOTE — Telephone Encounter (Signed)
Pre-op scheduled for 10/25/17 at 10:00am. LMOVM. Letter mailed.

## 2017-09-26 NOTE — Assessment & Plan Note (Signed)
Patient has persistent and significant GERD symptoms.  These do not occur every day but occur quite frequently.  Symptoms are typically in the morning.  Was started on Protonix daily with minimal to no improvement.  At this point I will have her stop Protonix and start [place].  We will provide samples for 1-2 weeks and request a progress report in 1-2 weeks.  We will further evaluate via EGD as per below.  Follow-up in 3 months.

## 2017-09-26 NOTE — Patient Instructions (Signed)
1. Stop taking Protonix and start taking Dexilant 60 mg.  Take this once a day, on an empty stomach. 2. We will provide you with samples to last 1-2 weeks.  Call us in 1-2 weeks and let us know if it is helping your reflux symptoms. 3. We will schedule your procedures for you. 4. Further recommendations will be made after your procedures. 5. Return for follow-up in 3 months. 6. Call if you have any questions or concerns. 7. Call your primary care doctor today and let them know that your blood pressure was elevated at 172/94.

## 2017-09-26 NOTE — Assessment & Plan Note (Signed)
The patient does admit intermittent/infrequent hematochezia.  Last episode was within the last couple months.  She also notes some dark stools.  She has a history of hemorrhoids.  She feels that her bleeding is from hemorrhoids.  Given her hematochezia as well as persistent weight loss we will plan for endoscopic evaluation as per above.  I will check a CBC and CMP today for any signs of overt blood loss.  Follow-up in 3 months.

## 2017-09-26 NOTE — Progress Notes (Signed)
Primary Care Physician:  Caryl Bis, MD Primary Gastroenterologist:  Dr. Gala Romney  Chief Complaint  Patient presents with  . Gastroesophageal Reflux  . Weight Loss  . Nausea  . Emesis    yellow, in the morning    HPI:   Brooke Greene is a 42 y.o. female who presents on referral from primary care for GERD and weight loss.  PCP notes reviewed including last office visit which occurred 07/30/2017.  She has a history of a nodule in the chest as well as weight loss.  When her nodule worked up which is been stable and nonprogressive with no associated symptoms.  Noted gradual unintentional weight loss of approximately 5 pounds in 1 month.  Noted alcohol consumption approximately twice a week.  At her last visit a recommended a CT of the chest with and without contrast to follow-up on pulmonary nodule.  She was referred to GI for workup of unexplained weight loss.  She was previously on Megace at which point she gained 15 pounds but is been off it for 1 month with a subsequent 5 pound weight loss.  Today she states she's doing ok. She's very nervous. Most recent CT chest showed the nodule is unchanged. She's been small her whole life, she got as low as 91 lbs around June/July 2018. She was started on Megace and added 15 lbs. They discontinued megace after 6 months and she lost about 5 lbs in a month. Peak weight 120 lb in November. She was 115 lb in December (PCP visit). Today she is 107 lb (lost another 8 lbs.) Has GERD symptoms, typically in the mornings regularly but not every day. Has associated nausea and occasionally regurgitates bilious substance. Rare esophageal burning. Is on Protonix for about a year; doesn't feel this ever helped her symptoms. Denies abdominal pain other than rare epigastric burning. Had an episode of hematochezia in the commode (bright red). States she has hemorrhoids. Admits "black sticky" stools, not often, last was less than a week ago. Denies fever, chills, acute  changes in bowel habits. Denies chest pain, dyspnea, dizziness, lightheadedness, syncope, near syncope. Denies any other upper or lower GI symptoms.  Takes Ibuprofen prn (averages once weekly). Denies ASA powders.  BP recheck was 170/92. Recent PCP visit was 114/84.  Past Medical History:  Diagnosis Date  . Acid reflux   . High cholesterol     Past Surgical History:  Procedure Laterality Date  . BREAST SURGERY  2002,2003   Cysts on left and right breasts  . HERNIA REPAIR  1997,2000   Left and right  . OOPHORECTOMY      Current Outpatient Medications  Medication Sig Dispense Refill  . albuterol (PROVENTIL HFA;VENTOLIN HFA) 108 (90 Base) MCG/ACT inhaler Inhale into the lungs as needed for wheezing or shortness of breath.    Marland Kitchen ibuprofen (ADVIL,MOTRIN) 200 MG tablet Take 400 mg by mouth every 8 (eight) hours as needed for moderate pain.    . pantoprazole (PROTONIX) 40 MG tablet Take 40 mg by mouth daily.     No current facility-administered medications for this visit.     Allergies as of 09/26/2017 - Review Complete 09/26/2017  Allergen Reaction Noted  . Penicillins Rash 12/09/2011    Family History  Problem Relation Age of Onset  . HIV Father   . Colon cancer Neg Hx   . Gastric cancer Neg Hx   . Esophageal cancer Neg Hx     Social History   Socioeconomic  History  . Marital status: Single    Spouse name: Not on file  . Number of children: Not on file  . Years of education: Not on file  . Highest education level: Not on file  Social Needs  . Financial resource strain: Not on file  . Food insecurity - worry: Not on file  . Food insecurity - inability: Not on file  . Transportation needs - medical: Not on file  . Transportation needs - non-medical: Not on file  Occupational History  . Not on file  Tobacco Use  . Smoking status: Current Every Day Smoker    Packs/day: 0.25    Years: 18.00    Pack years: 4.50    Types: Cigarettes  . Smokeless tobacco: Never Used   Substance and Sexual Activity  . Alcohol use: Yes    Comment: on the weekends-shots/2 beers  . Drug use: Yes    Frequency: 7.0 times per week    Types: Marijuana    Comment: last used 09/24/17  . Sexual activity: Yes    Birth control/protection: None  Other Topics Concern  . Not on file  Social History Narrative  . Not on file    Review of Systems: General: Negative for anorexia, fever, chills, fatigue, weakness. ENT: Negative for hoarseness, difficulty swallowing. CV: Negative for chest pain, angina, palpitations, peripheral edema.  Respiratory: Negative for dyspnea at rest, cough, sputum, wheezing.  GI: See history of present illness. MS: Negative for joint pain, low back pain.  Derm: Negative for rash or itching.  Endo: Negative for unusual weight change.  Heme: Negative for bruising or bleeding. Allergy: Negative for rash or hives.    Physical Exam: BP (!) 174/113   Pulse 84   Temp 98.3 F (36.8 C) (Oral)   Ht 5\' 3"  (1.6 m)   Wt 107 lb 9.6 oz (48.8 kg)   LMP 09/12/2017   BMI 19.06 kg/m  General:   Thin female. Alert and oriented. Pleasant and cooperative. Well-nourished and well-developed.  Head:  Normocephalic and atraumatic. Eyes:  Without icterus, sclera clear and conjunctiva pink.  Ears:  Normal auditory acuity. Cardiovascular:  S1, S2 present without murmurs appreciated. Extremities without clubbing or edema. Respiratory:  Clear to auscultation bilaterally. No wheezes, rales, or rhonchi. No distress.  Gastrointestinal:  +BS, soft, non-tender and non-distended. No HSM noted. No guarding or rebound. No masses appreciated.  Rectal:  Deferred  Musculoskalatal:  Symmetrical without gross deformities. Skin:  Intact without significant lesions or rashes. Neurologic:  Alert and oriented x4;  grossly normal neurologically. Psych:  Alert and cooperative. Normal mood and affect. Heme/Lymph/Immune: No excessive bruising noted.    09/26/2017 8:52 AM   Disclaimer:  This note was dictated with voice recognition software. Similar sounding words can inadvertently be transcribed and may not be corrected upon review.

## 2017-09-26 NOTE — Addendum Note (Signed)
Addended by: Gordy Levan, Hubbard Seldon A on: 09/26/2017 09:17 AM   Modules accepted: Orders

## 2017-10-22 NOTE — Patient Instructions (Signed)
Brooke Greene  10/22/2017     @PREFPERIOPPHARMACY @   Your procedure is scheduled on 11/01/2017.  Report to [name] at 6:45 A.M.  Call this number if you have problems the morning of surgery:  [phone]   Remember:  Do not eat food or drink liquids after midnight.  Take these medicines the morning of surgery with A SIP OF WATER Albuterol inhaler, Dexilant, Naxium, Flovent, Spiriva   Do not wear jewelry, make-up or nail polish.  Do not wear lotions, powders, or perfumes, or deodorant.  Do not shave 48 hours prior to surgery.  Men may shave face and neck.  Do not bring valuables to the hospital.  [facility] is not responsible for any belongings or valuables.  Contacts, dentures or bridgework may not be worn into surgery.  Leave your suitcase in the car.  After surgery it may be brought to your room.  For patients admitted to the hospital, discharge time will be determined by your treatment team.  Patients discharged the day of surgery will not be allowed to drive home.    Please read over the following fact sheets that you were given. Anesthesia Post-op Instructions     PATIENT INSTRUCTIONS POST-ANESTHESIA  IMMEDIATELY FOLLOWING SURGERY:  Do not drive or operate machinery for the first twenty four hours after surgery.  Do not make any important decisions for twenty four hours after surgery or while taking narcotic pain medications or sedatives.  If you develop intractable nausea and vomiting or a severe headache please notify your doctor immediately.  FOLLOW-UP:  Please make an appointment with your surgeon as instructed. You do not need to follow up with anesthesia unless specifically instructed to do so.  WOUND CARE INSTRUCTIONS (if applicable):  Keep a dry clean dressing on the anesthesia/puncture wound site if there is drainage.  Once the wound has quit draining you may leave it open to air.  Generally you should leave the bandage intact for twenty four hours unless  there is drainage.  If the epidural site drains for more than 36-48 hours please call the anesthesia department.  QUESTIONS?:  Please feel free to call your physician or the hospital operator if you have any questions, and they will be happy to assist you.      Esophagogastroduodenoscopy Esophagogastroduodenoscopy (EGD) is a procedure to examine the lining of the esophagus, stomach, and first part of the small intestine (duodenum). This procedure is done to check for problems such as inflammation, bleeding, ulcers, or growths. During this procedure, a long, flexible, lighted tube with a camera attached (endoscope) is inserted down the throat. Tell a health care provider about:  Any allergies you have.  All medicines you are taking, including vitamins, herbs, eye drops, creams, and over-the-counter medicines.  Any problems you or family members have had with anesthetic medicines.  Any blood disorders you have.  Any surgeries you have had.  Any medical conditions you have.  Whether you are pregnant or may be pregnant. What are the risks? Generally, this is a safe procedure. However, problems may occur, including:  Infection.  Bleeding.  A tear (perforation) in the esophagus, stomach, or duodenum.  Trouble breathing.  Excessive sweating.  Spasms of the larynx.  A slowed heartbeat.  Low blood pressure.  What happens before the procedure?  Follow instructions from your health care provider about eating or drinking restrictions.  Ask your health care provider about: ? Changing or stopping your regular medicines. This is especially important  if you are taking diabetes medicines or blood thinners. ? Taking medicines such as aspirin and ibuprofen. These medicines can thin your blood. Do not take these medicines before your procedure if your health care provider instructs you not to.  Plan to have someone take you home after the procedure.  If you wear dentures, be ready to  remove them before the procedure. What happens during the procedure?  To reduce your risk of infection, your health care team will wash or sanitize their hands.  An IV tube will be put in a vein in your hand or arm. You will get medicines and fluids through this tube.  You will be given one or more of the following: ? A medicine to help you relax (sedative). ? A medicine to numb the area (local anesthetic). This medicine may be sprayed into your throat. It will make you feel more comfortable and keep you from gagging or coughing during the procedure. ? A medicine for pain.  A mouth guard may be placed in your mouth to protect your teeth and to keep you from biting on the endoscope.  You will be asked to lie on your left side.  The endoscope will be lowered down your throat into your esophagus, stomach, and duodenum.  Air will be put into the endoscope. This will help your health care provider see better.  The lining of your esophagus, stomach, and duodenum will be examined.  Your health care provider may: ? Take a tissue sample so it can be looked at in a lab (biopsy). ? Remove growths. ? Remove objects (foreign bodies) that are stuck. ? Treat any bleeding with medicines or other devices that stop tissue from bleeding. ? Widen (dilate) or stretch narrowed areas of your esophagus and stomach.  The endoscope will be taken out. The procedure may vary among health care providers and hospitals. What happens after the procedure?  Your blood pressure, heart rate, breathing rate, and blood oxygen level will be monitored often until the medicines you were given have worn off.  Do not eat or drink anything until the numbing medicine has worn off and your gag reflex has returned. This information is not intended to replace advice given to you by your health care provider. Make sure you discuss any questions you have with your health care provider. Document Released: 12/01/2004 Document  Revised: 01/06/2016 Document Reviewed: 06/24/2015 Elsevier Interactive Patient Education  2018 [employer]. Colonoscopy, Adult A colonoscopy is an exam to look at the entire large intestine. During the exam, a lubricated, bendable tube is inserted into the anus and then passed into the rectum, colon, and other parts of the large intestine. A colonoscopy is often done as a part of normal colorectal screening or in response to certain symptoms, such as anemia, persistent diarrhea, abdominal pain, and blood in the stool. The exam can help screen for and diagnose medical problems, including:  Tumors.  Polyps.  Inflammation.  Areas of bleeding.  Tell a health care provider about:  Any allergies you have.  All medicines you are taking, including vitamins, herbs, eye drops, creams, and over-the-counter medicines.  Any problems you or family members have had with anesthetic medicines.  Any blood disorders you have.  Any surgeries you have had.  Any medical conditions you have.  Any problems you have had passing stool. What are the risks? Generally, this is a safe procedure. However, problems may occur, including:  Bleeding.  A tear in the intestine.  A  reaction to medicines given during the exam.  Infection (rare).  What happens before the procedure? Eating and drinking restrictions Follow instructions from your health care provider about eating and drinking, which may include:  A few days before the procedure - follow a low-fiber diet. Avoid nuts, seeds, dried fruit, raw fruits, and vegetables.  1-3 days before the procedure - follow a clear liquid diet. Drink only clear liquids, such as clear broth or bouillon, black coffee or tea, clear juice, clear soft drinks or sports drinks, gelatin dessert, and popsicles. Avoid any liquids that contain red or purple dye.  On the day of the procedure - do not eat or drink anything during the 2 hours before the procedure, or within the  time period that your health care provider recommends.  Bowel prep If you were prescribed an oral bowel prep to clean out your colon:  Take it as told by your health care provider. Starting the day before your procedure, you will need to drink a large amount of medicated liquid. The liquid will cause you to have multiple loose stools until your stool is almost clear or light green.  If your skin or anus gets irritated from diarrhea, you may use these to relieve the irritation: ? Medicated wipes, such as adult wet wipes with aloe and vitamin E. ? A skin soothing-product like petroleum jelly.  If you vomit while drinking the bowel prep, take a break for up to 60 minutes and then begin the bowel prep again. If vomiting continues and you cannot take the bowel prep without vomiting, call your health care provider.  General instructions  Ask your health care provider about changing or stopping your regular medicines. This is especially important if you are taking diabetes medicines or blood thinners.  Plan to have someone take you home from the hospital or clinic. What happens during the procedure?  An IV tube may be inserted into one of your veins.  You will be given medicine to help you relax (sedative).  To reduce your risk of infection: ? Your health care team will wash or sanitize their hands. ? Your anal area will be washed with soap.  You will be asked to lie on your side with your knees bent.  Your health care provider will lubricate a long, thin, flexible tube. The tube will have a camera and a light on the end.  The tube will be inserted into your anus.  The tube will be gently eased through your rectum and colon.  Air will be delivered into your colon to keep it open. You may feel some pressure or cramping.  The camera will be used to take images during the procedure.  A small tissue sample may be removed from your body to be examined under a microscope (biopsy). If any  potential problems are found, the tissue will be sent to a lab for testing.  If small polyps are found, your health care provider may remove them and have them checked for cancer cells.  The tube that was inserted into your anus will be slowly removed. The procedure may vary among health care providers and hospitals. What happens after the procedure?  Your blood pressure, heart rate, breathing rate, and blood oxygen level will be monitored until the medicines you were given have worn off.  Do not drive for 24 hours after the exam.  You may have a small amount of blood in your stool.  You may pass gas and have mild  abdominal cramping or bloating due to the air that was used to inflate your colon during the exam.  It is up to you to get the results of your procedure. Ask your health care provider, or the department performing the procedure, when your results will be ready. This information is not intended to replace advice given to you by your health care provider. Make sure you discuss any questions you have with your health care provider. Document Released: 07/28/2000 Document Revised: 05/31/2016 Document Reviewed: 10/12/2015 Elsevier Interactive Patient Education  2018 [employer].

## 2017-10-25 ENCOUNTER — Encounter (HOSPITAL_COMMUNITY)
Admission: RE | Admit: 2017-10-25 | Discharge: 2017-10-25 | Disposition: A | Discharge status: Home / Self Care | Payer: Managed Care, Other (non HMO) | Source: Ambulatory Visit | Attending: Internal Medicine | Admitting: Internal Medicine

## 2017-10-25 ENCOUNTER — Other Ambulatory Visit: Payer: Self-pay

## 2017-10-25 ENCOUNTER — Encounter (HOSPITAL_COMMUNITY): Payer: Self-pay

## 2017-10-25 DIAGNOSIS — Z01812 Encounter for preprocedural laboratory examination: Secondary | ICD-10-CM | POA: Insufficient documentation

## 2017-10-25 HISTORY — DX: Solitary cyst of unspecified breast: N60.09

## 2017-10-25 HISTORY — DX: Chronic obstructive pulmonary disease, unspecified: J44.9

## 2017-10-25 LAB — HCG, SERUM, QUALITATIVE: Preg, Serum: NEGATIVE

## 2017-11-01 ENCOUNTER — Encounter (HOSPITAL_COMMUNITY): Payer: Self-pay | Admitting: *Deleted

## 2017-11-01 ENCOUNTER — Ambulatory Visit (HOSPITAL_COMMUNITY): Payer: Managed Care, Other (non HMO) | Admitting: Anesthesiology

## 2017-11-01 ENCOUNTER — Encounter (HOSPITAL_COMMUNITY)
Admission: RE | Disposition: A | Discharge status: Home / Self Care | Payer: Self-pay | Source: Ambulatory Visit | Attending: Internal Medicine

## 2017-11-01 ENCOUNTER — Ambulatory Visit (HOSPITAL_COMMUNITY)
Admission: RE | Admit: 2017-11-01 | Discharge: 2017-11-01 | Disposition: A | Discharge status: Home / Self Care | Payer: Managed Care, Other (non HMO) | Source: Ambulatory Visit | Attending: Internal Medicine | Admitting: Internal Medicine

## 2017-11-01 DIAGNOSIS — K640 First degree hemorrhoids: Secondary | ICD-10-CM | POA: Diagnosis not present

## 2017-11-01 DIAGNOSIS — R12 Heartburn: Secondary | ICD-10-CM | POA: Diagnosis not present

## 2017-11-01 DIAGNOSIS — Z79899 Other long term (current) drug therapy: Secondary | ICD-10-CM | POA: Diagnosis not present

## 2017-11-01 DIAGNOSIS — E7800 Pure hypercholesterolemia, unspecified: Secondary | ICD-10-CM | POA: Insufficient documentation

## 2017-11-01 DIAGNOSIS — R634 Abnormal weight loss: Secondary | ICD-10-CM | POA: Insufficient documentation

## 2017-11-01 DIAGNOSIS — D125 Benign neoplasm of sigmoid colon: Secondary | ICD-10-CM | POA: Insufficient documentation

## 2017-11-01 DIAGNOSIS — Z880 Allergy status to penicillin: Secondary | ICD-10-CM | POA: Diagnosis not present

## 2017-11-01 DIAGNOSIS — K635 Polyp of colon: Secondary | ICD-10-CM

## 2017-11-01 DIAGNOSIS — K219 Gastro-esophageal reflux disease without esophagitis: Secondary | ICD-10-CM | POA: Diagnosis not present

## 2017-11-01 DIAGNOSIS — J449 Chronic obstructive pulmonary disease, unspecified: Secondary | ICD-10-CM | POA: Diagnosis not present

## 2017-11-01 DIAGNOSIS — Z7951 Long term (current) use of inhaled steroids: Secondary | ICD-10-CM | POA: Insufficient documentation

## 2017-11-01 DIAGNOSIS — Z681 Body mass index (BMI) 19 or less, adult: Secondary | ICD-10-CM | POA: Insufficient documentation

## 2017-11-01 DIAGNOSIS — Z8489 Family history of other specified conditions: Secondary | ICD-10-CM | POA: Insufficient documentation

## 2017-11-01 DIAGNOSIS — K3189 Other diseases of stomach and duodenum: Secondary | ICD-10-CM | POA: Insufficient documentation

## 2017-11-01 DIAGNOSIS — F17210 Nicotine dependence, cigarettes, uncomplicated: Secondary | ICD-10-CM | POA: Insufficient documentation

## 2017-11-01 DIAGNOSIS — K921 Melena: Secondary | ICD-10-CM

## 2017-11-01 HISTORY — PX: ESOPHAGOGASTRODUODENOSCOPY (EGD) WITH PROPOFOL: SHX5813

## 2017-11-01 HISTORY — PX: COLONOSCOPY WITH PROPOFOL: SHX5780

## 2017-11-01 HISTORY — PX: BIOPSY: SHX5522

## 2017-11-01 SURGERY — COLONOSCOPY WITH PROPOFOL
Anesthesia: Monitor Anesthesia Care

## 2017-11-01 MED ORDER — FENTANYL CITRATE (PF) 100 MCG/2ML IJ SOLN
25.0000 ug | Freq: Once | INTRAMUSCULAR | Status: AC
  Administered 2017-11-01: 25 ug via INTRAVENOUS

## 2017-11-01 MED ORDER — MIDAZOLAM HCL 2 MG/2ML IJ SOLN
INTRAMUSCULAR | Status: AC
  Filled 2017-11-01: qty 2

## 2017-11-01 MED ORDER — FENTANYL CITRATE (PF) 100 MCG/2ML IJ SOLN
INTRAMUSCULAR | Status: AC
  Filled 2017-11-01: qty 2

## 2017-11-01 MED ORDER — CHLORHEXIDINE GLUCONATE CLOTH 2 % EX PADS: 6.0000 | MEDICATED_PAD | Freq: Once | CUTANEOUS | Status: DC

## 2017-11-01 MED ORDER — MIDAZOLAM HCL 2 MG/2ML IJ SOLN
1.0000 mg | INTRAMUSCULAR | Status: AC
  Administered 2017-11-01: 2 mg via INTRAVENOUS

## 2017-11-01 MED ORDER — LIDOCAINE HCL (PF) 1 % IJ SOLN
INTRAMUSCULAR | Status: DC | PRN
  Administered 2017-11-01: 30 mg

## 2017-11-01 MED ORDER — PROPOFOL 10 MG/ML IV BOLUS
INTRAVENOUS | Status: AC
  Filled 2017-11-01: qty 60

## 2017-11-01 MED ORDER — LIDOCAINE HCL (PF) 1 % IJ SOLN
INTRAMUSCULAR | Status: AC
  Filled 2017-11-01: qty 5

## 2017-11-01 MED ORDER — PROPOFOL 500 MG/50ML IV EMUL
INTRAVENOUS | Status: DC | PRN
  Administered 2017-11-01: 150 ug/kg/min via INTRAVENOUS
  Administered 2017-11-01: 09:00:00 via INTRAVENOUS

## 2017-11-01 MED ORDER — LACTATED RINGERS IV SOLN
INTRAVENOUS | Status: DC
  Administered 2017-11-01: 09:00:00 via INTRAVENOUS

## 2017-11-01 MED ORDER — LIDOCAINE VISCOUS 2 % MT SOLN
15.0000 mL | Freq: Once | OROMUCOSAL | Status: AC
  Administered 2017-11-01: 10 mL via OROMUCOSAL

## 2017-11-01 NOTE — Op Note (Addendum)
[facility] Patient Name: Brooke Greene Procedure Date: 11/01/2017 8:35 AM MRN: [id number] Date of Birth: 05-05-1976 Attending MD: Norvel Richards , MD CSN: [id number] Age: 42 Admit Type: Outpatient Procedure:                Upper GI endoscopy Indications:              Heartburn; wt loss Providers:                Norvel Richards, MD, Hinton Rao, RN, Aram Candela Referring MD:             [facility]. Daniel MD, MD Medicines:                Propofol per Anesthesia Complications:            No immediate complications. Estimated Blood Loss:     Estimated blood loss was minimal. Procedure:                Pre-Anesthesia Assessment:                           - Prior to the procedure, a History and Physical                            was performed, and patient medications and                            allergies were reviewed. The patient's tolerance of                            previous anesthesia was also reviewed. The risks                            and benefits of the procedure and the sedation                            options and risks were discussed with the patient.                            All questions were answered, and informed consent                            was obtained. Prior Anticoagulants: The patient has                            taken no previous anticoagulant or antiplatelet                            agents. ASA Grade Assessment: II - A patient with                            mild systemic disease. After reviewing the risks  and benefits, the patient was deemed in                            satisfactory condition to undergo the procedure.                           After obtaining informed consent, the endoscope was                            passed under direct vision. Throughout the                            procedure, the patient's blood pressure, pulse, and                            oxygen  saturations were monitored continuously. The                            EG29-I10 ([id number]) scope was introduced through the                            and advanced to the second part of duodenum. The                            upper GI endoscopy was accomplished without                            difficulty. The patient tolerated the procedure                            well. Scope In: 9:12:41 AM Scope Out: 9:17:21 AM Total Procedure Duration: 0 hours 4 minutes 40 seconds  Findings:      The examined esophagus was normal.      The entire examined stomach was normal.      Diffuse mild mucosal changes characterized by flattening were found in       the second portion of the duodenum. This was biopsied with a cold       forceps for histology. Estimated blood loss was minimal. Impression:               - Normal esophagus.                           - Normal stomach.                           - Mucosal changes in the duodenum - nonspecific.                            Biopsied. Moderate Sedation:      Moderate (conscious) sedation was personally administered by an       anesthesia professional. The following parameters were monitored: oxygen       saturation, heart rate, blood pressure, respiratory rate, EKG, adequacy       of pulmonary ventilation, and response to care. Total physician       intraservice  time was 12 minutes. Recommendation:           - Patient has a contact number available for                            emergencies. The signs and symptoms of potential                            delayed complications were discussed with the                            patient. Return to normal activities tomorrow.                            Written discharge instructions were provided to the                            patient.                           - Resume previous diet.                           - Continue present medications.                           - Await pathology results.                            - No repeat upper endoscopy. Stop Nexium; Trial of                            Dexilant 60 mg daily. Free samples available at the                            Office.                           - Return to GI office (date not yet determined).                            See colonoscopy report. Procedure Code(s):        --- Professional ---                           [phone], Esophagogastroduodenoscopy, flexible,                            transoral; with biopsy, single or multiple Diagnosis Code(s):        --- Professional ---                           K31.89, Other diseases of stomach and duodenum                           R12, Heartburn CPT copyright 2016 [employer]. All rights reserved. The codes documented in  this report are preliminary and upon coder review may  be revised to meet current compliance requirements. Cristopher Estimable. Heather Streeper, MD Norvel Richards, MD 11/01/2017 9:22:44 AM This report has been signed electronically. Number of Addenda: 0

## 2017-11-01 NOTE — Anesthesia Postprocedure Evaluation (Signed)
Anesthesia Post Note  Patient: Brooke Greene  Procedure(s) Performed: COLONOSCOPY WITH PROPOFOL (N/A ) ESOPHAGOGASTRODUODENOSCOPY (EGD) WITH PROPOFOL (N/A ) BIOPSY  Patient location during evaluation: PACU Anesthesia Type: MAC Level of consciousness: awake and patient cooperative Pain management: pain level controlled Vital Signs Assessment: post-procedure vital signs reviewed and stable Respiratory status: spontaneous breathing, nonlabored ventilation and respiratory function stable Cardiovascular status: blood pressure returned to baseline Postop Assessment: no apparent nausea or vomiting Anesthetic complications: no     Last Vitals:  Vitals Value Taken Time  BP    Temp    Pulse    Resp    SpO2      Last Pain:  Vitals:   11/01/17 0752  TempSrc: Oral  PainSc:                  Teigen Bellin J

## 2017-11-01 NOTE — Discharge Instructions (Signed)
Colonoscopy Discharge Instructions  Read the instructions outlined below and refer to this sheet in the next few weeks. These discharge instructions provide you with general information on caring for yourself after you leave the hospital. Your doctor may also give you specific instructions. While your treatment has been planned according to the most current medical practices available, unavoidable complications occasionally occur. If you have any problems or questions after discharge, call Dr. Gala Romney at [phone]. ACTIVITY  You may resume your regular activity, but move at a slower pace for the next 24 hours.   Take frequent rest periods for the next 24 hours.   Walking will help get rid of the air and reduce the bloated feeling in your belly (abdomen).   No driving for 24 hours (because of the medicine (anesthesia) used during the test).    Do not sign any important legal documents or operate any machinery for 24 hours (because of the anesthesia used during the test).  NUTRITION  Drink plenty of fluids.   You may resume your normal diet as instructed by your doctor.   Begin with a light meal and progress to your normal diet. Heavy or fried foods are harder to digest and may make you feel sick to your stomach (nauseated).   Avoid alcoholic beverages for 24 hours or as instructed.  MEDICATIONS  You may resume your normal medications unless your doctor tells you otherwise.  WHAT YOU CAN EXPECT TODAY  Some feelings of bloating in the abdomen.   Passage of more gas than usual.   Spotting of blood in your stool or on the toilet paper.  IF YOU HAD POLYPS REMOVED DURING THE COLONOSCOPY:  No aspirin products for 7 days or as instructed.   No alcohol for 7 days or as instructed.   Eat a soft diet for the next 24 hours.  FINDING OUT THE RESULTS OF YOUR TEST Not all test results are available during your visit. If your test results are not back during the visit, make an appointment  with your caregiver to find out the results. Do not assume everything is normal if you have not heard from your caregiver or the medical facility. It is important for you to follow up on all of your test results.  SEEK IMMEDIATE MEDICAL ATTENTION IF:  You have more than a spotting of blood in your stool.   Your belly is swollen (abdominal distention).   You are nauseated or vomiting.   You have a temperature over 101.   You have abdominal pain or discomfort that is severe or gets worse throughout the day.   Esophagogastroduodenoscopy, Care After Refer to this sheet in the next few weeks. These instructions provide you with information about caring for yourself after your procedure. Your health care provider may also give you more specific instructions. Your treatment has been planned according to current medical practices, but problems sometimes occur. Call your health care provider if you have any problems or questions after your procedure. What can I expect after the procedure? After the procedure, it is common to have:  A sore throat.  Nausea.  Bloating.  Dizziness.  Fatigue.  Follow these instructions at home:  Do not eat or drink anything until the numbing medicine (local anesthetic) has worn off and your gag reflex has returned. You will know that the local anesthetic has worn off when you can swallow comfortably.  Do not drive for 24 hours if you received a medicine to help you relax (  sedative).  If your health care provider took a tissue sample for testing during the procedure, make sure to get your test results. This is your responsibility. Ask your health care provider or the department performing the test when your results will be ready.  Keep all follow-up visits as told by your health care provider. This is important. Contact a health care provider if:  You cannot stop coughing.  You are not urinating.  You are urinating less than usual. Get help right away  if:  You have trouble swallowing.  You cannot eat or drink.  You have throat or chest pain that gets worse.  You are dizzy or light-headed.  You faint.  You have nausea or vomiting.  You have chills.  You have a fever.  You have severe abdominal pain.  You have black, tarry, or bloody stools. This information is not intended to replace advice given to you by your health care provider. Make sure you discuss any questions you have with your health care provider. Document Released: 07/17/2012 Document Revised: 01/06/2016 Document Reviewed: 06/24/2015 Elsevier Interactive Patient Education  2018 [employer].   Hemorrhoids Hemorrhoids are swollen veins in and around the rectum or anus. There are two types of hemorrhoids:  Internal hemorrhoids. These occur in the veins that are just inside the rectum. They may poke through to the outside and become irritated and painful.  External hemorrhoids. These occur in the veins that are outside of the anus and can be felt as a painful swelling or hard lump near the anus.  Most hemorrhoids do not cause serious problems, and they can be managed with home treatments such as diet and lifestyle changes. If home treatments do not help your symptoms, procedures can be done to shrink or remove the hemorrhoids. What are the causes? This condition is caused by increased pressure in the anal area. This pressure may result from various things, including:  Constipation.  Straining to have a bowel movement.  Diarrhea.  Pregnancy.  Obesity.  Sitting for long periods of time.  Heavy lifting or other activity that causes you to strain.  Anal sex.  What are the signs or symptoms? Symptoms of this condition include:  Pain.  Anal itching or irritation.  Rectal bleeding.  Leakage of stool (feces).  Anal swelling.  One or more lumps around the anus.  How is this diagnosed? This condition can often be diagnosed through a visual exam.  Other exams or tests may also be done, such as:  Examination of the rectal area with a gloved hand (digital rectal exam).  Examination of the anal canal using a small tube (anoscope).  A blood test, if you have lost a significant amount of blood.  A test to look inside the colon (sigmoidoscopy or colonoscopy).  How is this treated? This condition can usually be treated at home. However, various procedures may be done if dietary changes, lifestyle changes, and other home treatments do not help your symptoms. These procedures can help make the hemorrhoids smaller or remove them completely. Some of these procedures involve surgery, and others do not. Common procedures include:  Rubber band ligation. Rubber bands are placed at the base of the hemorrhoids to cut off the blood supply to them.  Sclerotherapy. Medicine is injected into the hemorrhoids to shrink them.  Infrared coagulation. A type of light energy is used to get rid of the hemorrhoids.  Hemorrhoidectomy surgery. The hemorrhoids are surgically removed, and the veins that supply them are  tied off.  Stapled hemorrhoidopexy surgery. A circular stapling device is used to remove the hemorrhoids and use staples to cut off the blood supply to them.  Follow these instructions at home: Eating and drinking  Eat foods that have a lot of fiber in them, such as whole grains, beans, nuts, fruits, and vegetables. Ask your health care provider about taking products that have added fiber (fiber supplements).  Drink enough fluid to keep your urine clear or pale yellow. Managing pain and swelling  Take warm sitz baths for 20 minutes, 3-4 times a day to ease pain and discomfort.  If directed, apply ice to the affected area. Using ice packs between sitz baths may be helpful. ? Put ice in a plastic bag. ? Place a towel between your skin and the bag. ? Leave the ice on for 20 minutes, 2-3 times a day. General instructions  Take over-the-counter  and prescription medicines only as told by your health care provider.  Use medicated creams or suppositories as told.  Exercise regularly.  Go to the bathroom when you have the urge to have a bowel movement. Do not wait.  Avoid straining to have bowel movements.  Keep the anal area dry and clean. Use wet toilet paper or moist towelettes after a bowel movement.  Do not sit on the toilet for long periods of time. This increases blood pooling and pain. Contact a health care provider if:  You have increasing pain and swelling that are not controlled by treatment or medicine.  You have uncontrolled bleeding.  You have difficulty having a bowel movement, or you are unable to have a bowel movement.  You have pain or inflammation outside the area of the hemorrhoids. This information is not intended to replace advice given to you by your health care provider. Make sure you discuss any questions you have with your health care provider. Document Released: 07/28/2000 Document Revised: 12/29/2015 Document Reviewed: 04/14/2015 Elsevier Interactive Patient Education  2018 [employer].   Colon Polyps Polyps are tissue growths inside the body. Polyps can grow in many places, including the large intestine (colon). A polyp may be a round bump or a mushroom-shaped growth. You could have one polyp or several. Most colon polyps are noncancerous (benign). However, some colon polyps can become cancerous over time. What are the causes? The exact cause of colon polyps is not known. What increases the risk? This condition is more likely to develop in people who:  Have a family history of colon cancer or colon polyps.  Are older than 60 or older than 45 if they are African American.  Have inflammatory bowel disease, such as ulcerative colitis or Crohn disease.  Are overweight.  Smoke cigarettes.  Do not get enough exercise.  Drink too much alcohol.  Eat a diet that is: ? High in fat and red  meat. ? Low in fiber.  Had childhood cancer that was treated with abdominal radiation.  What are the signs or symptoms? Most polyps do not cause symptoms. If you have symptoms, they may include:  Blood coming from your rectum when having a bowel movement.  Blood in your stool.The stool may look dark red or black.  A change in bowel habits, such as constipation or diarrhea.  How is this diagnosed? This condition is diagnosed with a colonoscopy. This is a procedure that uses a lighted, flexible scope to look at the inside of your colon. How is this treated? Treatment for this condition involves removing any  polyps that are found. Those polyps will then be tested for cancer. If cancer is found, your health care provider will talk to you about options for colon cancer treatment. Follow these instructions at home: Diet  Eat plenty of fiber, such as fruits, vegetables, and whole grains.  Eat foods that are high in calcium and vitamin D, such as milk, cheese, yogurt, eggs, liver, fish, and broccoli.  Limit foods high in fat, red meats, and processed meats, such as hot dogs, sausage, bacon, and lunch meats.  Maintain a healthy weight, or lose weight if recommended by your health care provider. General instructions  Do not smoke cigarettes.  Do not drink alcohol excessively.  Keep all follow-up visits as told by your health care provider. This is important. This includes keeping regularly scheduled colonoscopies. Talk to your health care provider about when you need a colonoscopy.  Exercise every day or as told by your health care provider. Contact a health care provider if:  You have new or worsening bleeding during a bowel movement.  You have new or increased blood in your stool.  You have a change in bowel habits.  You unexpectedly lose weight. This information is not intended to replace advice given to you by your health care provider. Make sure you discuss any questions you  have with your health care provider. Document Released: 04/26/2004 Document Revised: 01/06/2016 Document Reviewed: 06/21/2015 Elsevier Interactive Patient Education  2018 [place] and colon polyp information provided  Course of Anusol cream as directed  Stop Nexium; go by my office for  Samples of Dexilant;  We have 2 bottles waiting for you  Further recommendations to follow pending review of pathology report

## 2017-11-01 NOTE — H&P (Signed)
_0 @   Primary Care Physician:  Caryl Bis, MD Primary Gastroenterologist:  Dr. Gala Romney  Pre-Procedure History & Physical: HPI:  Brooke Greene is a 42 y.o. female here for further evaluation of refractory GERD , weight loss, and rectal bleeding. Could not get Dexilant as it was too expensive.  Past Medical History:  Diagnosis Date  . Acid reflux   . Breast cyst bilateral   . COPD (chronic obstructive pulmonary disease) ([place])   . High cholesterol     Past Surgical History:  Procedure Laterality Date  . BREAST SURGERY  2002,2003   Cysts on left and right breasts  . HERNIA REPAIR  1997,2000   Left and right  . OOPHORECTOMY      Prior to Admission medications   Medication Sig Start Date End Date Taking? Authorizing Provider  albuterol (PROVENTIL HFA;VENTOLIN HFA) 108 (90 Base) MCG/ACT inhaler Inhale 2 puffs into the lungs every 6 (six) hours as needed for wheezing or shortness of breath.    Yes [provider]  esomeprazole (NEXIUM) 40 MG capsule Take 40 mg by mouth daily.   Yes [provider]  fluticasone (FLOVENT HFA) 110 MCG/ACT inhaler Inhale 2 puffs into the lungs 2 (two) times daily as needed (shortness of breath).   Yes [provider]  hydrochlorothiazide (HYDRODIURIL) 25 MG tablet Take 25 mg by mouth daily.   Yes [provider]  tiotropium (SPIRIVA) 18 MCG inhalation capsule Place 18 mcg into inhaler and inhale daily.   Yes [provider]  dexlansoprazole (DEXILANT) 60 MG capsule Take 1 capsule (60 mg total) by mouth daily. Patient not taking: Reported on 10/18/2017 09/26/17   Carlis Stable, NP  Na Sulfate-K Sulfate-Mg Sulf 17.5-3.13-1.6 GM/177ML SOLN Take 1 kit by mouth as directed. 09/26/17   Danie Binder, MD    Allergies as of 09/26/2017 - Review Complete 09/26/2017  Allergen Reaction Noted  . Penicillins Rash 12/09/2011    Family History  Problem Relation Age of Onset  . HIV Father   . Colon cancer Neg Hx   .  Gastric cancer Neg Hx   . Esophageal cancer Neg Hx     Social History   Socioeconomic History  . Marital status: Single    Spouse name: Not on file  . Number of children: Not on file  . Years of education: Not on file  . Highest education level: Not on file  Occupational History  . Not on file  Social Needs  . Financial resource strain: Not on file  . Food insecurity:    Worry: Not on file    Inability: Not on file  . Transportation needs:    Medical: Not on file    Non-medical: Not on file  Tobacco Use  . Smoking status: Current Every Day Smoker    Packs/day: 0.25    Years: 18.00    Pack years: 4.50    Types: Cigarettes  . Smokeless tobacco: Never Used  Substance and Sexual Activity  . Alcohol use: Yes    Comment: on the weekends-shots/2 beers  . Drug use: Yes    Frequency: 7.0 times per week    Types: Marijuana    Comment: last used 10/25/2017  . Sexual activity: Yes    Birth control/protection: None  Lifestyle  . Physical activity:    Days per week: Not on file    Minutes per session: Not on file  . Stress: Not on file  Relationships  . Social connections:  Talks on phone: Not on file    Gets together: Not on file    Attends religious service: Not on file    Active member of club or organization: Not on file    Attends meetings of clubs or organizations: Not on file    Relationship status: Not on file  . Intimate partner violence:    Fear of current or ex partner: Not on file    Emotionally abused: Not on file    Physically abused: Not on file    Forced sexual activity: Not on file  Other Topics Concern  . Not on file  Social History Narrative  . Not on file    Review of Systems: See HPI, otherwise negative ROS  Physical Exam: Temp (P) 98.6 F (37 C) (Oral)   Ht _0  (1.6 m)   Wt 100 lb (45.4 kg)   LMP 11/01/2017 Comment: starrted period 3 days ago  BMI 17.71 kg/m  General:   Alert,  Well-developed, well-nourished, pleasant and cooperative  in NAD Neck:  Supple; no masses or thyromegaly. No significant cervical adenopathy. Lungs:  Clear throughout to auscultation.   No wheezes, crackles, or rhonchi. No acute distress. Heart:  Regular rate and rhythm; no murmurs, clicks, rubs,  or gallops. Abdomen: Non-distended, normal bowel sounds.  Soft and nontender without appreciable mass or hepatosplenomegaly.  Pulses:  Normal pulses noted. Extremities:  Without clubbing or edema.  Impression:  42 year old lady with refractory  GERD weight loss and rectal bleeding.  Recommendations:I have offered the patient both a diagnostic EGD and colonoscopy today per plan. The risks, benefits, limitations, imponderables and alternatives regarding both EGD and colonoscopy have been reviewed with the patient. Questions have been answered. All parties agreeable.    Notice: This dictation was prepared with Dragon dictation along with smaller phrase technology. Any transcriptional errors that result from this process are unintentional and may not be corrected upon review.

## 2017-11-01 NOTE — Op Note (Signed)
[facility] Patient Name: Brooke Greene Procedure Date: 11/01/2017 9:19 AM MRN: [id number] Date of Birth: [birth year] Attending MD: Norvel Richards , MD CSN: [id number] Age: 42 Admit Type: Outpatient Procedure:                Colonoscopy Indications:              Hematochezia Providers:                Norvel Richards, MD, Hinton Rao, RN, Aram Candela Referring MD:              Medicines:                Propofol per Anesthesia Complications:            No immediate complications. Estimated Blood Loss:     Estimated blood loss was minimal. Procedure:                Pre-Anesthesia Assessment:                           - Prior to the procedure, a History and Physical                            was performed, and patient medications and                            allergies were reviewed. The patient's tolerance of                            previous anesthesia was also reviewed. The risks                            and benefits of the procedure and the sedation                            options and risks were discussed with the patient.                            All questions were answered, and informed consent                            was obtained. Prior Anticoagulants: The patient has                            taken no previous anticoagulant or antiplatelet                            agents. ASA Grade Assessment: II - A patient with                            mild systemic disease. After reviewing the risks                            and  benefits, the patient was deemed in                            satisfactory condition to undergo the procedure.                           After obtaining informed consent, the colonoscope                            was passed under direct vision. Throughout the                            procedure, the patient's blood pressure, pulse, and                            oxygen saturations were monitored  continuously. The                            EC-[id number] ([id number]) scope was introduced through                            the anus and advanced to the the cecum, identified                            by appendiceal orifice and ileocecal valve. The                            colonoscopy was performed without difficulty. The                            patient tolerated the procedure well. The quality                            of the bowel preparation was adequate. The                            ileocecal valve, appendiceal orifice, and rectum                            were photographed. The colonoscopy was performed                            without difficulty. The patient tolerated the                            procedure well. The entire colon was well                            visualized. Scope In: 9:24:18 AM Scope Out: 9:34:45 AM Scope Withdrawal Time: 0 hours 7 minutes 30 seconds  Total Procedure Duration: 0 hours 10 minutes 27 seconds  Findings:      The perianal and digital rectal examinations were normal.      A 4 mm polyp was found in the sigmoid colon. The polyp was sessile. The  polyp was removed with a hot snare. Resection and retrieval were       complete. The polyp was removed with a cold snare. Resection and       retrieval were complete. Estimated blood loss was minimal.      Internal hemorrhoids were found during retroflexion. The hemorrhoids       were moderate, medium-sized and Grade I (internal hemorrhoids that do       not prolapse).      The exam was otherwise without abnormality on direct and retroflexion       views. Impression:               - One 4 mm polyp in the sigmoid colon, removed with                            a hot snare and removed with a cold snare. Resected                            and retrieved.                           - Internal hemorrhoids.                           - The examination was otherwise normal on direct                             and retroflexion views. Moderate Sedation:      Moderate (conscious) sedation was personally administered by an       anesthesia professional. The following parameters were monitored: oxygen       saturation, heart rate, blood pressure, respiratory rate, EKG, adequacy       of pulmonary ventilation, and response to care. Total physician       intraservice time was 18 minutes. Recommendation:           - Patient has a contact number available for                            emergencies. The signs and symptoms of potential                            delayed complications were discussed with the                            patient. Return to normal activities tomorrow.                            Written discharge instructions were provided to the                            patient.                           - Resume previous diet.                           - Continue present medications.                           -  Repeat colonoscopy date to be determined after                            pending pathology results are reviewed for                            surveillance based on pathology results.                           - Return to GI clinic after studies are complete.                            Course of Anusol cream. See EGD report. Procedure Code(s):        --- Professional ---                           [phone], Colonoscopy, flexible; with removal of                            tumor(s), polyp(s), or other lesion(s) by snare                            technique Diagnosis Code(s):        --- Professional ---                           D12.5, Benign neoplasm of sigmoid colon                           K64.0, First degree hemorrhoids                           K92.1, Melena (includes Hematochezia) CPT copyright 2016 American [employer] Association. All rights reserved. The codes documented in this report are preliminary and upon coder review may  be revised to meet current compliance  requirements. Cristopher Estimable. Jowanna Loeffler, MD Norvel Richards, MD 11/01/2017 9:54:58 AM This report has been signed electronically. Number of Addenda: 0

## 2017-11-01 NOTE — Transfer of Care (Signed)
Immediate Anesthesia Transfer of Care Note  Patient: [name]  Procedure(s) Performed: COLONOSCOPY WITH PROPOFOL (N/A ) ESOPHAGOGASTRODUODENOSCOPY (EGD) WITH PROPOFOL (N/A ) BIOPSY  Patient Location: PACU  Anesthesia Type:MAC  Level of Consciousness: awake and patient cooperative  Airway & Oxygen Therapy: Patient Spontanous Breathing and Patient connected to nasal cannula oxygen  Post-op Assessment: Report given to RN and Post -op Vital signs reviewed and stable  Post vital signs: Reviewed and stable  Last Vitals:  Vitals Value Taken Time  BP    Temp    Pulse    Resp    SpO2      Last Pain:  Vitals:   11/01/17 0752  TempSrc: Oral  PainSc:       Patients Stated Pain Goal: 7 (05/30/50 0258)  Complications: No apparent anesthesia complications

## 2017-11-01 NOTE — Anesthesia Preprocedure Evaluation (Signed)
Anesthesia Evaluation  Patient identified by MRN, date of birth, ID band Patient awake    Reviewed: Allergy & Precautions, H&P , NPO status , Patient's Chart, lab work & pertinent test results  Airway Mallampati: II  TM Distance: >3 FB Neck ROM: Full    Dental no notable dental hx.    Pulmonary COPD, Current Smoker,    Pulmonary exam normal        Cardiovascular negative cardio ROS Normal cardiovascular exam Rhythm:Regular Rate:Normal     Neuro/Psych negative neurological ROS  negative psych ROS   GI/Hepatic GERD  Medicated and Controlled,(+)     substance abuse  marijuana use,   Endo/Other  negative endocrine ROS  Renal/GU negative Renal ROS     Musculoskeletal negative musculoskeletal ROS (+)   Abdominal Normal abdominal exam  (+)   Peds  Hematology  (+) Blood dyscrasia, anemia ,   Anesthesia Other Findings   Reproductive/Obstetrics negative OB ROS                             Anesthesia Physical Anesthesia Plan  ASA: III  Anesthesia Plan: MAC   Post-op Pain Management:    Induction: Intravenous  PONV Risk Score and Plan:   Airway Management Planned: Simple Face Mask  Additional Equipment:   Intra-op Plan:   Post-operative Plan:   Informed Consent: I have reviewed the patients History and Physical, chart, labs and discussed the procedure including the risks, benefits and alternatives for the proposed anesthesia with the patient or authorized representative who has indicated his/her understanding and acceptance.     Plan Discussed with:   Anesthesia Plan Comments:         Anesthesia Quick Evaluation

## 2017-11-01 NOTE — OR Nursing (Signed)
Patient on her menstrual cycle now since 3 days. Peri pad in use.

## 2017-11-06 ENCOUNTER — Encounter (HOSPITAL_COMMUNITY): Payer: Self-pay | Admitting: Internal Medicine

## 2017-11-10 ENCOUNTER — Encounter: Payer: Self-pay | Admitting: Internal Medicine

## 2017-12-12 ENCOUNTER — Encounter: Payer: Self-pay | Admitting: Nurse Practitioner

## 2017-12-24 ENCOUNTER — Ambulatory Visit: Payer: Managed Care, Other (non HMO) | Admitting: Nurse Practitioner

## 2017-12-27 ENCOUNTER — Ambulatory Visit: Payer: Managed Care, Other (non HMO) | Admitting: Nurse Practitioner

## 2018-02-25 ENCOUNTER — Ambulatory Visit (INDEPENDENT_AMBULATORY_CARE_PROVIDER_SITE_OTHER): Payer: Managed Care, Other (non HMO) | Admitting: Cardiology

## 2018-02-25 ENCOUNTER — Encounter: Payer: Self-pay | Admitting: Cardiology

## 2018-02-25 VITALS — BP 118/72 | HR 93 | Ht 63.0 in | Wt 97.8 lb

## 2018-02-25 DIAGNOSIS — R079 Chest pain, unspecified: Secondary | ICD-10-CM

## 2018-02-25 NOTE — Patient Instructions (Signed)
Medication Instructions:   Your physician recommends that you continue on your current medications as directed. Please refer to the Current Medication list given to you today.  Labwork:  NONE  Testing/Procedures:  NONE  Follow-Up:  Your physician recommends that you schedule a follow-up appointment in: as needed.   Any Other Special Instructions Will Be Listed Below (If Applicable).  If you need a refill on your cardiac medications before your next appointment, please call your pharmacy. 

## 2018-02-25 NOTE — Progress Notes (Signed)
Clinical Summary Brooke Greene is a 42 y.o.female seen as new consult, referred by [facility] for chest pain.   1. Chest pain - symptoms started 5 months. ER visit 01/2018 with chest pain to [facility]. Trop neg, EKG nonacute. CXR no acute process - tightness mostly at night. Under left breast. Can be 7/10 in severity. +SOB, diaphoretic. Worst with movement. Can last 2-3 hours constant pain. 2 main episodes. Can have some mild episodes.  - only at night that his occurs - heavy lifting and walking at work, can have some fatigue.      CAD risk factors: +tobacco x 15-20 years now quit, HTN, HL diet controlled  Past Medical History:  Diagnosis Date  . Acid reflux   . Breast cyst bilateral   . COPD (chronic obstructive pulmonary disease) ([facility])   . High cholesterol      Allergies  Allergen Reactions  . Penicillins Rash    Has patient had a PCN reaction causing immediate rash, facial/tongue/throat swelling, SOB or lightheadedness with hypotension: Yes Has patient had a PCN reaction causing severe rash involving mucus membranes or skin necrosis: No Has patient had a PCN reaction that required hospitalization: No Has patient had a PCN reaction occurring within the last 10 years: Yes If all of the above answers are "NO", then may proceed with Cephalosporin use.      Current Outpatient Medications  Medication Sig Dispense Refill  . albuterol (PROVENTIL HFA;VENTOLIN HFA) 108 (90 Base) MCG/ACT inhaler Inhale 2 puffs into the lungs every 6 (six) hours as needed for wheezing or shortness of breath.     . dexlansoprazole (DEXILANT) 60 MG capsule Take 1 capsule (60 mg total) by mouth daily. (Patient not taking: Reported on 10/18/2017) 90 capsule 3  . fluticasone (FLOVENT HFA) 110 MCG/ACT inhaler Inhale 2 puffs into the lungs 2 (two) times daily as needed (shortness of breath).    . hydrochlorothiazide (HYDRODIURIL) 25 MG tablet Take 25 mg by mouth daily.    Marland Kitchen tiotropium (SPIRIVA) 18 MCG  inhalation capsule Place 18 mcg into inhaler and inhale daily.     No current facility-administered medications for this visit.      Past Surgical History:  Procedure Laterality Date  . BIOPSY  11/01/2017   Procedure: BIOPSY;  Surgeon: Daneil Dolin, MD;  [facility];  Service: Endoscopy;;  duodenal bx  . BREAST SURGERY  2002,2003   Cysts on left and right breasts  . COLONOSCOPY WITH PROPOFOL N/A 11/01/2017   Procedure: COLONOSCOPY WITH PROPOFOL;  Surgeon: Daneil Dolin, MD;  [facility];  Service: Endoscopy;  Laterality: N/A;  8:30am  . ESOPHAGOGASTRODUODENOSCOPY (EGD) WITH PROPOFOL N/A 11/01/2017   Procedure: ESOPHAGOGASTRODUODENOSCOPY (EGD) WITH PROPOFOL;  Surgeon: Daneil Dolin, MD;  [facility];  Service: Endoscopy;  Laterality: N/A;  . HERNIA REPAIR  1997,2000   Left and right  . OOPHORECTOMY       Allergies  Allergen Reactions  . Penicillins Rash    Has patient had a PCN reaction causing immediate rash, facial/tongue/throat swelling, SOB or lightheadedness with hypotension: Yes Has patient had a PCN reaction causing severe rash involving mucus membranes or skin necrosis: No Has patient had a PCN reaction that required hospitalization: No Has patient had a PCN reaction occurring within the last 10 years: Yes If all of the above answers are "NO", then may proceed with Cephalosporin use.       Family History  Problem  Relation Age of Onset  . HIV Father   . Colon cancer Neg Hx   . Gastric cancer Neg Hx   . Esophageal cancer Neg Hx      Social History [name] reports that she has been smoking cigarettes.  She has a 4.50 pack-year smoking history. She has never used smokeless tobacco. [name] reports that she drinks alcohol.   Review of Systems CONSTITUTIONAL: No weight loss, fever, chills, weakness or fatigue.  HEENT: Eyes: No visual loss, blurred vision, double vision or yellow sclerae.No hearing loss, sneezing,  congestion, runny nose or sore throat.  SKIN: No rash or itching.  CARDIOVASCULAR: per hpi RESPIRATORY: per hpi GASTROINTESTINAL: No anorexia, nausea, vomiting or diarrhea. No abdominal pain or blood.  GENITOURINARY: No burning on urination, no polyuria NEUROLOGICAL: No headache, dizziness, syncope, paralysis, ataxia, numbness or tingling in the extremities. No change in bowel or bladder control.  MUSCULOSKELETAL: No muscle, back pain, joint pain or stiffness.  LYMPHATICS: No enlarged nodes. No history of splenectomy.  PSYCHIATRIC: No history of depression or anxiety.  ENDOCRINOLOGIC: No reports of sweating, cold or heat intolerance. No polyuria or polydipsia.  Marland Kitchen   Physical Examination Vitals:   02/25/18 1435  BP: 118/72  Pulse: 93  SpO2: 99%   Vitals:   02/25/18 1435  Weight: 97 lb 12.8 oz (44.4 kg)  Height: 5\' 3"  (1.6 m)    Gen: resting comfortably, no acute distress HEENT: no scleral icterus, pupils equal round and reactive, no palptable cervical adenopathy,  CV: RRR, no mr/g, no jvd Resp: Clear to auscultation bilaterally GI: abdomen is soft, non-tender, non-distended, normal bowel sounds, no hepatosplenomegaly MSK: extremities are warm, no edema.  Skin: warm, no rash Neuro:  no focal deficits Psych: appropriate affect     Assessment and Plan  1. Chest pain - symptoms noncardiac in description. Would not plan for ischemic testing at this time - EKG today shows SR, no ischemic changes - if symptoms change in character would reeveluate at that time   F/u  As needed     Arnoldo Lenis, M.D.

## 2018-03-10 ENCOUNTER — Encounter: Payer: Self-pay | Admitting: Cardiology

## 2018-03-20 ENCOUNTER — Encounter: Payer: Self-pay | Admitting: Nurse Practitioner

## 2018-03-20 ENCOUNTER — Ambulatory Visit: Payer: Managed Care, Other (non HMO) | Admitting: Nurse Practitioner

## 2018-03-20 NOTE — Progress Notes (Deleted)
Referring Provider: Caryl Bis, MD Primary Care Physician:  Patient, No Pcp Per Primary GI:  Dr.   Rayne Du chief complaint on file.   HPI:   Brooke Greene is a 42 y.o. female who presents for follow-up on GERD and weight loss.  The patient was last seen in our office 09/26/2017 for the same as well as hematochezia.  She was initially referred from primary care with a noted history of weight loss and chest nodules.  Nodule has been worked up and found to be nonprogressive and no associated symptoms.  Was previously on Megace and gained 15 pounds but is been off it for a month with subsequent 5 pound weight loss.  At her last visit she is doing okay, recent CT showed unchanged nodule.  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  Peak weight of 120 pounds in November.  At her primary care visit she was 115 pounds at her last visit with [place] she was 107 pounds (8 pound weight loss in 2 months).  GERD symptoms typically morning time but not every day and associated nausea with bowel regurgitation.  Has been on Protonix for about a year and does not feel it is helped.  She had a single episode of hematochezia in the setting of known hemorrhoids.  No other GI symptoms.  Recommended change Protonix to Dexilant 60 mg daily, request 1 to 2-week follow-up with samples provided, colonoscopy and upper endoscopy, follow-up in 3 months.  Also strongly recommended she contact her primary care related to hypertension.  She did not call progress report to our office and apparently she tried Nexium instead.  Colonoscopy and EGD were both completed 11/01/2017.  EGD found normal esophagus, normal stomach, nonspecific duodenal mucosal changes status post biopsy.  Surgical pathology found the biopsies to be mucosal tissue without specific histopathological changes.  Recommended stop Nexium and start Dexilant.  Colonoscopy found a 4 mm sigmoid colon polyp which was sessile, internal  hemorrhoids which were moderate, medium sized, grade 1.  Otherwise normal exam.  Surgical pathology found the polyp to be hyperplastic.  Recommended repeat colonoscopy in 10 years.   Today she states   Past Medical History:  Diagnosis Date  . Acid reflux   . Breast cyst bilateral   . COPD (chronic obstructive pulmonary disease) ([facility])   . High cholesterol     Past Surgical History:  Procedure Laterality Date  . BIOPSY  11/01/2017   Procedure: BIOPSY;  Surgeon: Daneil Dolin, MD;  [facility];  Service: Endoscopy;;  duodenal bx  . BREAST SURGERY  2002,2003   Cysts on left and right breasts  . COLONOSCOPY WITH PROPOFOL N/A 11/01/2017   Procedure: COLONOSCOPY WITH PROPOFOL;  Surgeon: Daneil Dolin, MD;  [facility];  Service: Endoscopy;  Laterality: N/A;  8:30am  . ESOPHAGOGASTRODUODENOSCOPY (EGD) WITH PROPOFOL N/A 11/01/2017   Procedure: ESOPHAGOGASTRODUODENOSCOPY (EGD) WITH PROPOFOL;  Surgeon: Daneil Dolin, MD;  [facility];  Service: Endoscopy;  Laterality: N/A;  . HERNIA REPAIR  1997,2000   Left and right  . OOPHORECTOMY      Current Outpatient Medications  Medication Sig Dispense Refill  . albuterol (PROVENTIL HFA;VENTOLIN HFA) 108 (90 Base) MCG/ACT inhaler Inhale 2 puffs into the lungs every 6 (six) hours as needed for wheezing or shortness of breath.     Marland Kitchen amLODipine (NORVASC) 5 MG tablet Take 5 mg by mouth daily.  3  . dexlansoprazole (DEXILANT) 60 MG capsule Take 1 capsule (60 mg total) by mouth daily. 90 capsule 3  . fluticasone (FLOVENT HFA) 110 MCG/ACT inhaler Inhale 2 puffs into the lungs 2 (two) times daily as needed (shortness of breath).    . tiotropium (SPIRIVA) 18 MCG inhalation capsule Place 18 mcg into inhaler and inhale daily.    . traZODone (DESYREL) 50 MG tablet Take 1 tablet by mouth at bedtime as needed.  2   No current facility-administered medications for this visit.     Allergies as of 03/20/2018 - Review Complete  03/10/2018  Allergen Reaction Noted  . Penicillins Rash 12/09/2011    Family History  Problem Relation Age of Onset  . HIV Father   . Colon cancer Neg Hx   . Gastric cancer Neg Hx   . Esophageal cancer Neg Hx     Social History   Socioeconomic History  . Marital status: Single    Spouse name: Not on file  . Number of children: Not on file  . Years of education: Not on file  . Highest education level: Not on file  Occupational History  . Not on file  Social Needs  . Financial resource strain: Not on file  . Food insecurity:    Worry: Not on file    Inability: Not on file  . Transportation needs:    Medical: Not on file    Non-medical: Not on file  Tobacco Use  . Smoking status: Former Smoker    Packs/day: 0.25    Years: 18.00    Pack years: 4.50    Types: Cigarettes    Last attempt to quit: 09/14/2017    Years since quitting: 0.5  . Smokeless tobacco: Never Used  Substance and Sexual Activity  . Alcohol use: Yes    Comment: on the weekends-shots/2 beers  . Drug use: Yes    Frequency: 7.0 times per week    Types: Marijuana    Comment: last used 10/25/2017  . Sexual activity: Yes    Birth control/protection: None  Lifestyle  . Physical activity:    Days per week: Not on file    Minutes per session: Not on file  . Stress: Not on file  Relationships  . Social connections:    Talks on phone: Not on file    Gets together: Not on file    Attends religious service: Not on file    Active member of club or organization: Not on file    Attends meetings of clubs or organizations: Not on file    Relationship status: Not on file  Other Topics Concern  . Not on file  Social History Narrative  . Not on file    Review of Systems: General: Negative for anorexia, weight loss, fever, chills, fatigue, weakness. Eyes: Negative for vision changes.  ENT: Negative for hoarseness, difficulty swallowing , nasal congestion. CV: Negative for chest pain, angina, palpitations,  dyspnea on exertion, peripheral edema.  Respiratory: Negative for dyspnea at rest, dyspnea on exertion, cough, sputum, wheezing.  GI: See history of present illness. GU:  Negative for dysuria, hematuria, urinary incontinence, urinary frequency, nocturnal urination.  MS: Negative for joint pain, low back pain.  Derm: Negative for rash or itching.  Neuro: Negative for weakness, abnormal sensation, seizure, frequent headaches, memory loss, confusion.  Psych: Negative for anxiety, depression, suicidal ideation, hallucinations.  Endo: Negative for unusual weight change.  Heme: Negative for bruising or bleeding. Allergy: Negative for rash or  hives.   Physical Exam: There were no vitals taken for this visit. General:   Alert and oriented. Pleasant and cooperative. Well-nourished and well-developed.  Head:  Normocephalic and atraumatic. Eyes:  Without icterus, sclera clear and conjunctiva pink.  Ears:  Normal auditory acuity. Mouth:  No deformity or lesions, oral mucosa pink.  Throat/Neck:  Supple, without mass or thyromegaly. Cardiovascular:  S1, S2 present without murmurs appreciated. Normal pulses noted. Extremities without clubbing or edema. Respiratory:  Clear to auscultation bilaterally. No wheezes, rales, or rhonchi. No distress.  Gastrointestinal:  +BS, soft, non-tender and non-distended. No HSM noted. No guarding or rebound. No masses appreciated.  Rectal:  Deferred  Musculoskalatal:  Symmetrical without gross deformities. Normal posture. Skin:  Intact without significant lesions or rashes. Neurologic:  Alert and oriented x4;  grossly normal neurologically. Psych:  Alert and cooperative. Normal mood and affect. Heme/Lymph/Immune: No significant cervical adenopathy. No excessive bruising noted.    03/20/2018 1:02 PM   Disclaimer: This note was dictated with voice recognition software. Similar sounding words can inadvertently be transcribed and may not be corrected upon review.

## 2018-10-10 ENCOUNTER — Ambulatory Visit: Payer: Managed Care, Other (non HMO) | Admitting: Nurse Practitioner

## 2018-12-16 ENCOUNTER — Other Ambulatory Visit: Payer: Self-pay

## 2018-12-16 ENCOUNTER — Ambulatory Visit: Payer: Managed Care, Other (non HMO) | Admitting: Nurse Practitioner

## 2018-12-16 NOTE — Progress Notes (Signed)
Office visit cancelled by the patient.

## 2018-12-24 ENCOUNTER — Encounter: Payer: Self-pay | Admitting: Nurse Practitioner

## 2018-12-24 ENCOUNTER — Other Ambulatory Visit: Payer: Self-pay

## 2018-12-24 ENCOUNTER — Ambulatory Visit (INDEPENDENT_AMBULATORY_CARE_PROVIDER_SITE_OTHER): Payer: Managed Care, Other (non HMO) | Admitting: Nurse Practitioner

## 2018-12-24 DIAGNOSIS — K921 Melena: Secondary | ICD-10-CM

## 2018-12-24 DIAGNOSIS — K219 Gastro-esophageal reflux disease without esophagitis: Secondary | ICD-10-CM | POA: Diagnosis not present

## 2018-12-24 DIAGNOSIS — R634 Abnormal weight loss: Secondary | ICD-10-CM | POA: Diagnosis not present

## 2018-12-24 MED ORDER — DEXLANSOPRAZOLE 60 MG PO CPDR: 60.0000 mg | DELAYED_RELEASE_CAPSULE | Freq: Every day | ORAL | 3 refills | Status: DC

## 2018-12-24 NOTE — Patient Instructions (Signed)
Your health issues we discussed today were:   GERD (reflux/heartburn): 1. I am sending a refill of Dexilant to your pharmacy. 2. We will mail you a co-pay card which should reduce your cost of the medication 3. Call us if it is still too expensive at which point we will likely increase your Protonix to 40 mg twice a day 4. Call us if you have any worsening or severe symptoms  Weight loss: 1. I am glad your weight loss has improved. 2. Keep an eye on your weight and notify us of any further significant weight loss  Blood in stools: 1. I am glad you are not having any further bleeding. 2. As we discussed, your colonoscopy looked essentially normal other than a single polyp which was noncancerous 3. You should have your next colonoscopy in 10 years at age 43 4. Let us know any further rectal bleeding  Overall I recommend:  1. Continue your other current medications 2. Call us if you have any questions or concerns 3. Follow-up in 6 months.   Because of recent events of COVID-19 ("Coronavirus"), follow CDC recommendations:  1. Wash your hand frequently 2. Avoid touching your face 3. Stay away from people who are sick 4. If you have symptoms such as fever, cough, shortness of breath then call your healthcare provider for further guidance 5. If you are sick, STAY AT HOME unless otherwise directed by your healthcare provider. 6. Follow directions from state and national officials regarding staying safe   At [facility] we value your feedback. You may receive a survey about your visit today. Please share your experience as we strive to create trusting relationships with our patients to provide genuine, compassionate, quality care.  We appreciate your understanding and patience as we review any laboratory studies, imaging, and other diagnostic tests that are ordered as we care for you. Our office policy is 5 business days for review of these results, and any emergent or  urgent results are addressed in a timely manner for your best interest. If you do not hear from our office in 1 week, please contact us.   We also encourage the use of MyChart, which contains your medical information for your review as well. If you are not enrolled in this feature, an access code is on this after visit summary for your convenience. Thank you for allowing [place] to be involved in your care.  It was great to see you today!  I hope you have a great day!!

## 2018-12-24 NOTE — Addendum Note (Signed)
Addended by: Gordy Levan, Nandita Mathenia A on: 12/24/2018 01:54 PM   Modules accepted: Orders

## 2018-12-24 NOTE — Progress Notes (Signed)
Referring Provider: Caryl Bis, MD Primary Care Physician:  Patient, No Pcp Per Primary GI:  Dr. Gala Romney  NOTE: Service was provided via telemedicine and was requested by the patient due to COVID-19 pandemic.  Method of visit: Doxy.Me  Patient Location: Home  Provider Location: Office  Reason for Phone Visit: Follow-up  The patient was consented to phone follow-up via telephone encounter including billing of the encounter (yes/no): Yes  Persons present on the phone encounter, with roles: None  Total time (minutes) spent on medical discussion: 15 minutes  Chief Complaint  Patient presents with  . Gastroesophageal Reflux    HPI:   Brooke Greene is a 43 y.o. female who presents for virtual visit regarding: Follow-up on weight loss, GERD.  Patient was last seen in our office 09/26/2017 for the same as well as hematochezia.  Previous primary care referral noted history of nodule in her chest as well as weight loss.  Nodule work-up stable, nonprogressive, no associated symptoms.  Was previously on Megace at which point she gained 15 pounds but has been off it for 1 month with subsequent 5 pound weight loss.  She notes that she has been small her whole life, as low as 91 pounds in 2018.  Peak weight of 120 pounds in November and noted to be 115 in December.  At her last visit she was 107, another 8 pound weight loss.  GERD symptoms typically mornings but not daily with associated nausea and occasional regurgitation of bilious substance.  On Protonix for about a year and does not feel this is helped.  Noted episode of hematochezia with known hemorrhoids.  Notes "black sticky "stools but not often.  No other GI complaints.  Takes ibuprofen on average once weekly.  Recommended stop Protonix and start Dexilant, schedule colonoscopy and upper endoscopy, follow-up in 3 months.  Colonoscopy completed 11/01/2017 which found a single 4 mm polyp in the sigmoid colon, internal hemorrhoids,  otherwise normal.  Surgical pathology found the polyp to be hyperplastic.  Recommended repeat colonoscopy in 10 years for screening purposes.  EGD completed the same day found normal esophagus, stomach.  Nonspecific duodenal changes status post biopsy.  Surgical pathology, biopsies to be duodenal mucosa with no specific histopathological changes.  Recommended stop Nexium and trial Dexilant.  Today she states she's doing ok overall. Intermittently her stomach is bothering her. She is on Protonix currently. Tried Dexilant samples which she feels worked better, but it was more expensive. She did not receive a copay card. It was going to be $200. Denies other abdominal pain, N/V, hematochezia, melena. Weight has been increasing (has put on 10 lbs); last weight at home was 110 lbs. Denies URI and flu0-like symptoms. Denies loss of taste/smell. Denies chest pain, dyspnea, dizziness, lightheadedness, syncope, near syncope. Denies any other upper or lower GI symptoms.  Past Medical History:  Diagnosis Date  . Acid reflux   . Breast cyst bilateral   . COPD (chronic obstructive pulmonary disease) ([place])   . High cholesterol     Past Surgical History:  Procedure Laterality Date  . BIOPSY  11/01/2017   Procedure: BIOPSY;  Surgeon: Daneil Dolin, MD;  [facility];  Service: Endoscopy;;  duodenal bx  . BREAST SURGERY  2002,2003   Cysts on left and right breasts  . COLONOSCOPY WITH PROPOFOL N/A 11/01/2017   Procedure: COLONOSCOPY WITH PROPOFOL;  Surgeon: Daneil Dolin, MD;  Location: [facility];  Service: Endoscopy;  Laterality:  N/A;  8:30am  . ESOPHAGOGASTRODUODENOSCOPY (EGD) WITH PROPOFOL N/A 11/01/2017   Procedure: ESOPHAGOGASTRODUODENOSCOPY (EGD) WITH PROPOFOL;  Surgeon: Daneil Dolin, MD;  Location: [facility];  Service: Endoscopy;  Laterality: N/A;  . HERNIA REPAIR  1997,2000   Left and right  . OOPHORECTOMY      Current Outpatient Medications  Medication Sig Dispense Refill   . amLODipine (NORVASC) 5 MG tablet Take 5 mg by mouth daily.  3  . ferrous sulfate 325 (65 FE) MG EC tablet Take 325 mg by mouth as needed.    . Multiple Vitamins-Minerals (MULTIVITAMIN WITH MINERALS) tablet Take 1 tablet by mouth daily.    . pantoprazole (PROTONIX) 40 MG tablet Take 40 mg by mouth daily.    Marland Kitchen dexlansoprazole (DEXILANT) 60 MG capsule Take 1 capsule (60 mg total) by mouth daily. (Patient not taking: Reported on 12/24/2018) 90 capsule 3   No current facility-administered medications for this visit.     Allergies as of 12/24/2018 - Review Complete 12/24/2018  Allergen Reaction Noted  . Penicillins Rash 12/09/2011    Family History  Problem Relation Age of Onset  . HIV Father   . Colon cancer Neg Hx   . Gastric cancer Neg Hx   . Esophageal cancer Neg Hx     Social History   Socioeconomic History  . Marital status: Single    Spouse name: Not on file  . Number of children: Not on file  . Years of education: Not on file  . Highest education level: Not on file  Occupational History  . Not on file  Social Needs  . Financial resource strain: Not on file  . Food insecurity:    Worry: Not on file    Inability: Not on file  . Transportation needs:    Medical: Not on file    Non-medical: Not on file  Tobacco Use  . Smoking status: Former Smoker    Packs/day: 0.25    Years: 18.00    Pack years: 4.50    Types: Cigarettes    Last attempt to quit: 09/14/2017    Years since quitting: 1.2  . Smokeless tobacco: Never Used  Substance and Sexual Activity  . Alcohol use: Yes    Comment: on the weekends-shots/2 beers  . Drug use: Yes    Frequency: 7.0 times per week    Types: Marijuana    Comment: last used 10/25/2017  . Sexual activity: Yes    Birth control/protection: None  Lifestyle  . Physical activity:    Days per week: Not on file    Minutes per session: Not on file  . Stress: Not on file  Relationships  . Social connections:    Talks on phone: Not on file     Gets together: Not on file    Attends religious service: Not on file    Active member of club or organization: Not on file    Attends meetings of clubs or organizations: Not on file    Relationship status: Not on file  Other Topics Concern  . Not on file  Social History Narrative  . Not on file    Review of Systems: General: Negative for anorexia, weight loss, fever, chills, fatigue, weakness. ENT: Negative for hoarseness, difficulty swallowing. CV: Negative for chest pain, angina, palpitations, peripheral edema.  Respiratory: Negative for dyspnea at rest, cough, sputum, wheezing.  GI: See history of present illness. Endo: Negative for unusual weight change.  Heme: Negative for bruising or bleeding.  Allergy: Negative for rash or hives.  Physical Exam: Note: limited exam due to virtual visit General:   Alert and oriented. Pleasant and cooperative. Well-nourished and well-developed.  Head:  Normocephalic and atraumatic. Eyes:  Without icterus, sclera clear and conjunctiva pink.  Ears:  Normal auditory acuity. Skin:  Intact without facial significant lesions or rashes. Neurologic:  Alert and oriented x4;  grossly normal neurologically. Psych:  Alert and cooperative. Normal mood and affect. Heme/Lymph/Immune: No excessive bruising noted.

## 2018-12-24 NOTE — Assessment & Plan Note (Signed)
GERD symptoms intermittently controlled on Protonix 40 mg once a day.  She tried Dexilant which she felt worked better however the cost was going to be $200 a month and she was not able to afford this.  She was not provided a co-pay card.  I will refill her Dexilant and we will mail her a co-pay card to see if this makes the medication affordable.  If not, we can increase her Protonix to twice daily and see if this has any effect.  Follow-up in 6 months regardless.

## 2018-12-24 NOTE — Assessment & Plan Note (Signed)
No further rectal bleeding noted.  Colonoscopy up-to-date next due in 10 years at age 43.  Call if any recurrent rectal bleeding.

## 2018-12-24 NOTE — Assessment & Plan Note (Signed)
Weight loss has stabilized.  She is actually gained about 10 pounds and she is very happy about this.  She feels well.  Recommend she continue monitoring her weight and notify us of any recurrent weight loss.

## 2018-12-25 ENCOUNTER — Encounter: Payer: Self-pay | Admitting: Internal Medicine

## 2018-12-26 NOTE — Progress Notes (Signed)
CC'D TO PCP °

## 2019-05-26 ENCOUNTER — Other Ambulatory Visit: Payer: Self-pay

## 2019-05-26 ENCOUNTER — Encounter (HOSPITAL_COMMUNITY): Payer: Self-pay | Admitting: *Deleted

## 2019-05-26 ENCOUNTER — Emergency Department (HOSPITAL_COMMUNITY)
Admission: EM | Admit: 2019-05-26 | Discharge: 2019-05-26 | Disposition: A | Discharge status: Home / Self Care | Payer: Managed Care, Other (non HMO) | Attending: Emergency Medicine | Admitting: Emergency Medicine

## 2019-05-26 ENCOUNTER — Emergency Department (HOSPITAL_COMMUNITY): Payer: Managed Care, Other (non HMO)

## 2019-05-26 DIAGNOSIS — Z79899 Other long term (current) drug therapy: Secondary | ICD-10-CM | POA: Insufficient documentation

## 2019-05-26 DIAGNOSIS — R1011 Right upper quadrant pain: Secondary | ICD-10-CM | POA: Diagnosis not present

## 2019-05-26 DIAGNOSIS — R109 Unspecified abdominal pain: Secondary | ICD-10-CM | POA: Insufficient documentation

## 2019-05-26 DIAGNOSIS — Z87891 Personal history of nicotine dependence: Secondary | ICD-10-CM | POA: Diagnosis not present

## 2019-05-26 DIAGNOSIS — F1290 Cannabis use, unspecified, uncomplicated: Secondary | ICD-10-CM | POA: Diagnosis not present

## 2019-05-26 DIAGNOSIS — R112 Nausea with vomiting, unspecified: Secondary | ICD-10-CM

## 2019-05-26 LAB — URINALYSIS, ROUTINE W REFLEX MICROSCOPIC
Bacteria, UA: NONE SEEN
Bilirubin Urine: NEGATIVE
Glucose, UA: NEGATIVE mg/dL
Hgb urine dipstick: NEGATIVE
Ketones, ur: NEGATIVE mg/dL
Nitrite: NEGATIVE
Protein, ur: NEGATIVE mg/dL
Specific Gravity, Urine: 1.02 (ref 1.005–1.030)
pH: 7 (ref 5.0–8.0)

## 2019-05-26 LAB — COMPREHENSIVE METABOLIC PANEL
ALT: 16 U/L (ref 0–44)
AST: 26 U/L (ref 15–41)
Albumin: 4.3 g/dL (ref 3.5–5.0)
Alkaline Phosphatase: 35 U/L — ABNORMAL LOW (ref 38–126)
Anion gap: 10 (ref 5–15)
BUN: 10 mg/dL (ref 6–20)
CO2: 23 mmol/L (ref 22–32)
Calcium: 8.8 mg/dL — ABNORMAL LOW (ref 8.9–10.3)
Chloride: 104 mmol/L (ref 98–111)
Creatinine, Ser: 0.62 mg/dL (ref 0.44–1.00)
GFR calc Af Amer: >60 mL/min (ref >60)
GFR calc non Af Amer: >60 mL/min (ref >60)
Glucose, Bld: 106 mg/dL — ABNORMAL HIGH (ref 70–99)
Potassium: 3.9 mmol/L (ref 3.5–5.1)
Sodium: 137 mmol/L (ref 135–145)
Total Bilirubin: 0.3 mg/dL (ref 0.3–1.2)
Total Protein: 7.2 g/dL (ref 6.5–8.1)

## 2019-05-26 LAB — CBC
HCT: 36.5 % (ref 36.0–46.0)
Hemoglobin: 11.8 g/dL — ABNORMAL LOW (ref 12.0–15.0)
MCH: 29.9 pg (ref 26.0–34.0)
MCHC: 32.3 g/dL (ref 30.0–36.0)
MCV: 92.6 fL (ref 80.0–100.0)
Platelets: 262 10*3/uL (ref 150–400)
RBC: 3.94 MIL/uL (ref 3.87–5.11)
RDW: 15 % (ref 11.5–15.5)
WBC: 7.8 10*3/uL (ref 4.0–10.5)
nRBC: 0 % (ref 0.0–0.2)

## 2019-05-26 LAB — RAPID URINE DRUG SCREEN, HOSP PERFORMED
Amphetamines: NOT DETECTED
Barbiturates: NOT DETECTED
Benzodiazepines: NOT DETECTED
Cocaine: NOT DETECTED
Opiates: NOT DETECTED
Tetrahydrocannabinol: POSITIVE — AB

## 2019-05-26 LAB — POCT PREGNANCY, URINE: Preg Test, Ur: NEGATIVE

## 2019-05-26 LAB — LIPASE, BLOOD: Lipase: 24 U/L (ref 11–51)

## 2019-05-26 MED ORDER — ONDANSETRON 4 MG PO TBDP
4.0000 mg | ORAL_TABLET | Freq: Once | ORAL | Status: AC | PRN
  Administered 2019-05-26: 4 mg via ORAL
  Filled 2019-05-26: qty 1

## 2019-05-26 MED ORDER — METOCLOPRAMIDE HCL 5 MG/ML IJ SOLN
10.0000 mg | Freq: Once | INTRAMUSCULAR | Status: AC
  Administered 2019-05-26: 08:00:00 10 mg via INTRAVENOUS
  Filled 2019-05-26: qty 2

## 2019-05-26 MED ORDER — SODIUM CHLORIDE 0.9 % IV BOLUS
1000.0000 mL | Freq: Once | INTRAVENOUS | Status: AC
  Administered 2019-05-26: 1000 mL via INTRAVENOUS

## 2019-05-26 MED ORDER — ONDANSETRON 8 MG PO TBDP: 8.0000 mg | ORAL_TABLET | Freq: Three times a day (TID) | ORAL | 0 refills | Status: DC | PRN

## 2019-05-26 MED ORDER — MORPHINE SULFATE (PF) 4 MG/ML IV SOLN
4.0000 mg | Freq: Once | INTRAVENOUS | Status: AC
  Administered 2019-05-26: 4 mg via INTRAVENOUS
  Filled 2019-05-26: qty 1

## 2019-05-26 MED ORDER — ALBUTEROL SULFATE HFA 108 (90 BASE) MCG/ACT IN AERS
INHALATION_SPRAY | RESPIRATORY_TRACT | Status: AC
  Filled 2019-05-26: qty 6.7

## 2019-05-26 NOTE — ED Triage Notes (Signed)
Abdominal pain around umbilicus, has kept her up all night, pt is nauseated, has been taking pepcid. Pt has ultrasound scheduled for 1000.

## 2019-05-26 NOTE — ED Provider Notes (Signed)
[facility] EMERGENCY DEPARTMENT Provider Note   CSN: [id number] Arrival date & time: 05/26/19  0531     History   Chief Complaint Chief Complaint  Patient presents with  . Abdominal Pain    HPI Brooke Greene is a 43 y.o. female.     Patient c/o mid to upper abdominal pain and nausea/vomiting since last pm. Patient's symptoms acute onset, moderate, episodic, recurrent, since approximately 10 pm last night. Hx similar symptoms intermittent in past. Emesis clear to yellowish, not bloody or bilious. No abd distension. Having normal bms. No lower abd or pelvic pain. No dysuria or gu c/o. No back or flank pain. No fever or chills. No known bad food ingestion or ill contacts. Denies hx pud, pancreatitis or gallstones. States was due to have u/s this AM to evaluate similar symptoms in past. lnmp 4 weeks ago.   The history is provided by the patient.  Abdominal Pain Associated symptoms: nausea and vomiting   Associated symptoms: no chest pain, no chills, no cough, no dysuria, no fever, no shortness of breath, no sore throat, no vaginal bleeding and no vaginal discharge     Past Medical History:  Diagnosis Date  . Acid reflux   . Breast cyst bilateral   . COPD (chronic obstructive pulmonary disease) ([place])   . High cholesterol     Patient Active Problem List   Diagnosis Date Noted  . Loss of weight 09/26/2017  . GERD (gastroesophageal reflux disease) 09/26/2017  . Hematochezia 09/26/2017    Past Surgical History:  Procedure Laterality Date  . BIOPSY  11/01/2017   Procedure: BIOPSY;  Surgeon: Daneil Dolin, MD;  [facility];  Service: Endoscopy;;  duodenal bx  . BREAST SURGERY  2002,2003   Cysts on left and right breasts  . COLONOSCOPY WITH PROPOFOL N/A 11/01/2017   Procedure: COLONOSCOPY WITH PROPOFOL;  Surgeon: Daneil Dolin, MD;  [facility];  Service: Endoscopy;  Laterality: N/A;  8:30am  . ESOPHAGOGASTRODUODENOSCOPY (EGD) WITH PROPOFOL N/A  11/01/2017   Procedure: ESOPHAGOGASTRODUODENOSCOPY (EGD) WITH PROPOFOL;  Surgeon: Daneil Dolin, MD;  [facility];  Service: Endoscopy;  Laterality: N/A;  . HERNIA REPAIR  1997,2000   Left and right  . OOPHORECTOMY       OB History   No obstetric history on file.      Home Medications    Prior to Admission medications   Medication Sig Start Date End Date Taking? Authorizing Provider  amLODipine (NORVASC) 5 MG tablet Take 5 mg by mouth daily. 02/09/18   [provider]  dexlansoprazole (DEXILANT) 60 MG capsule Take 1 capsule (60 mg total) by mouth daily. 12/24/18   Carlis Stable, NP  ferrous sulfate 325 (65 FE) MG EC tablet Take 325 mg by mouth as needed.    [provider]  Multiple Vitamins-Minerals (MULTIVITAMIN WITH MINERALS) tablet Take 1 tablet by mouth daily.    [provider]  pantoprazole (PROTONIX) 40 MG tablet Take 40 mg by mouth daily.    [provider]    Family History Family History  Problem Relation Age of Onset  . HIV Father   . Colon cancer Neg Hx   . Gastric cancer Neg Hx   . Esophageal cancer Neg Hx     Social History Social History   Tobacco Use  . Smoking status: Former Smoker    Packs/day: 0.25    Years: 18.00    Pack years: 4.50  Types: Cigarettes    Quit date: 09/14/2017    Years since quitting: 1.6  . Smokeless tobacco: Never Used  Substance Use Topics  . Alcohol use: Yes    Comment: on the weekends-shots/2 beers  . Drug use: Yes    Frequency: 7.0 times per week    Types: Marijuana    Comment: last used 05/25/19     Allergies   Penicillins   Review of Systems Review of Systems  Constitutional: Negative for chills and fever.  HENT: Negative for sore throat.   Eyes: Negative for redness.  Respiratory: Negative for cough and shortness of breath.   Cardiovascular: Negative for chest pain.  Gastrointestinal: Positive for abdominal pain, nausea and vomiting.  Endocrine: Negative for  polyuria.  Genitourinary: Negative for dysuria, flank pain, vaginal bleeding and vaginal discharge.  Musculoskeletal: Negative for back pain.  Skin: Negative for rash.  Neurological: Negative for headaches.  Hematological: Does not bruise/bleed easily.  Psychiatric/Behavioral: Negative for confusion.     Physical Exam Updated Vital Signs BP 115/88   Pulse 89   Temp (!) 97.3 F (36.3 C) (Oral)   Ht 1.6 m (5\' 3" )   Wt 47.6 kg   LMP  (LMP Unknown)   SpO2 100%   BMI 18.60 kg/m   Physical Exam Vitals signs and nursing note reviewed.  Constitutional:      Appearance: Normal appearance. She is well-developed.  HENT:     Head: Atraumatic.     Nose: Nose normal.     Mouth/Throat:     Mouth: Mucous membranes are moist.  Eyes:     General: No scleral icterus.    Conjunctiva/sclera: Conjunctivae normal.  Neck:     Musculoskeletal: Normal range of motion and neck supple. No neck rigidity or muscular tenderness.     Trachea: No tracheal deviation.  Cardiovascular:     Rate and Rhythm: Normal rate and regular rhythm.     Pulses: Normal pulses.     Heart sounds: Normal heart sounds. No murmur. No friction rub. No gallop.   Pulmonary:     Effort: Pulmonary effort is normal. No respiratory distress.     Breath sounds: Normal breath sounds.  Abdominal:     General: Bowel sounds are normal. There is no distension.     Palpations: Abdomen is soft. There is no mass.     Tenderness: There is no abdominal tenderness. There is no guarding or rebound.     Hernia: No hernia is present.  Genitourinary:    Comments: No cva tenderness.  Musculoskeletal:        General: No swelling.  Skin:    General: Skin is warm and dry.     Findings: No rash.  Neurological:     Mental Status: She is alert.     Comments: Alert, speech normal.   Psychiatric:        Mood and Affect: Mood normal.      ED Treatments / Results  Labs (all labs ordered are listed, but only abnormal results are  displayed) Results for orders placed or performed during the hospital encounter of 05/26/19  Lipase, blood  Result Value Ref Range   Lipase 24 11 - 51 U/L  Comprehensive metabolic panel  Result Value Ref Range   Sodium 137 135 - 145 mmol/L   Potassium 3.9 3.5 - 5.1 mmol/L   Chloride 104 98 - 111 mmol/L   CO2 23 22 - 32 mmol/L   Glucose, Bld 106 (H) 70 -  99 mg/dL   BUN 10 6 - 20 mg/dL   Creatinine, Ser 0.62 0.44 - 1.00 mg/dL   Calcium 8.8 (L) 8.9 - 10.3 mg/dL   Total Protein 7.2 6.5 - 8.1 g/dL   Albumin 4.3 3.5 - 5.0 g/dL   AST 26 15 - 41 U/L   ALT 16 0 - 44 U/L   Alkaline Phosphatase 35 (L) 38 - 126 U/L   Total Bilirubin 0.3 0.3 - 1.2 mg/dL   GFR calc non Af Amer >60 >60 mL/min   GFR calc Af Amer >60 >60 mL/min   Anion gap 10 5 - 15  CBC  Result Value Ref Range   WBC 7.8 4.0 - 10.5 K/uL   RBC 3.94 3.87 - 5.11 MIL/uL   Hemoglobin 11.8 (L) 12.0 - 15.0 g/dL   HCT 36.5 36.0 - 46.0 %   MCV 92.6 80.0 - 100.0 fL   MCH 29.9 26.0 - 34.0 pg   MCHC 32.3 30.0 - 36.0 g/dL   RDW 15.0 11.5 - 15.5 %   Platelets 262 150 - 400 K/uL   nRBC 0.0 0.0 - 0.2 %  Urinalysis, Routine w reflex microscopic  Result Value Ref Range   Color, Urine YELLOW YELLOW   APPearance HAZY (A) CLEAR   Specific Gravity, Urine 1.020 1.005 - 1.030   pH 7.0 5.0 - 8.0   Glucose, UA NEGATIVE NEGATIVE mg/dL   Hgb urine dipstick NEGATIVE NEGATIVE   Bilirubin Urine NEGATIVE NEGATIVE   Ketones, ur NEGATIVE NEGATIVE mg/dL   Protein, ur NEGATIVE NEGATIVE mg/dL   Nitrite NEGATIVE NEGATIVE   Leukocytes,Ua TRACE (A) NEGATIVE   RBC / HPF 0-5 0 - 5 RBC/hpf   WBC, UA 0-5 0 - 5 WBC/hpf   Bacteria, UA NONE SEEN NONE SEEN   Squamous Epithelial / LPF 6-10 0 - 5   Mucus PRESENT   Rapid urine drug screen (hospital performed)  Result Value Ref Range   Opiates NONE DETECTED NONE DETECTED   Cocaine NONE DETECTED NONE DETECTED   Benzodiazepines NONE DETECTED NONE DETECTED   Amphetamines NONE DETECTED NONE DETECTED    Tetrahydrocannabinol POSITIVE (A) NONE DETECTED   Barbiturates NONE DETECTED NONE DETECTED  Pregnancy, urine POC  Result Value Ref Range   Preg Test, Ur NEGATIVE NEGATIVE   Dg Abd 2 Views  Result Date: 05/26/2019 CLINICAL DATA:  Worsening paraumbilical abdominal pain for 1 month. EXAM: ABDOMEN - 2 VIEW COMPARISON:  07/31/2017 FINDINGS: No evidence of dilated bowel loops. There is no evidence of free air. A small bowel loop in the left abdomen appears to show mild wall thickening, suspicious for enteritis. Several pelvic phleboliths again noted. No radio-opaque calculi or other significant radiographic abnormality is seen. IMPRESSION: Probable mild thickening of small bowel loop in left abdomen, suspicious for enteritis. Electronically Signed   By: Marlaine Hind M.D.   On: 05/26/2019 08:15   US Abdomen Limited Ruq  Result Date: 05/26/2019 CLINICAL DATA:  Abdominal pain. EXAM: ULTRASOUND ABDOMEN LIMITED RIGHT UPPER QUADRANT COMPARISON:  None. FINDINGS: Gallbladder: No gallstones or wall thickening visualized. No sonographic Murphy sign noted by sonographer. Common bile duct: Diameter: 2.8 mm Liver: No focal lesion identified. Within normal limits in parenchymal echogenicity. Portal vein is patent on color Doppler imaging with normal direction of blood flow towards the liver. Other: None. IMPRESSION: Normal study. Electronically Signed   By: Dorise Bullion III M.D   On: 05/26/2019 09:27    EKG None  Radiology Dg Abd 2 Views  Result Date: 05/26/2019 CLINICAL DATA:  Worsening paraumbilical abdominal pain for 1 month. EXAM: ABDOMEN - 2 VIEW COMPARISON:  07/31/2017 FINDINGS: No evidence of dilated bowel loops. There is no evidence of free air. A small bowel loop in the left abdomen appears to show mild wall thickening, suspicious for enteritis. Several pelvic phleboliths again noted. No radio-opaque calculi or other significant radiographic abnormality is seen. IMPRESSION: Probable mild thickening  of small bowel loop in left abdomen, suspicious for enteritis. Electronically Signed   By: Marlaine Hind M.D.   On: 05/26/2019 08:15   US Abdomen Limited Ruq  Result Date: 05/26/2019 CLINICAL DATA:  Abdominal pain. EXAM: ULTRASOUND ABDOMEN LIMITED RIGHT UPPER QUADRANT COMPARISON:  None. FINDINGS: Gallbladder: No gallstones or wall thickening visualized. No sonographic Murphy sign noted by sonographer. Common bile duct: Diameter: 2.8 mm Liver: No focal lesion identified. Within normal limits in parenchymal echogenicity. Portal vein is patent on color Doppler imaging with normal direction of blood flow towards the liver. Other: None. IMPRESSION: Normal study. Electronically Signed   By: Dorise Bullion III M.D   On: 05/26/2019 09:27    Procedures Procedures (including critical care time)  Medications Ordered in ED Medications  metoCLOPramide (REGLAN) injection 10 mg (has no administration in time range)  morphine 4 MG/ML injection 4 mg (has no administration in time range)  sodium chloride 0.9 % bolus 1,000 mL (has no administration in time range)  ondansetron (ZOFRAN-ODT) disintegrating tablet 4 mg (4 mg Oral Given 05/26/19 0603)     Initial Impression / Assessment and Plan / ED Course  I have reviewed the triage vital signs and the nursing notes.  Pertinent labs & imaging results that were available during my care of the patient were reviewed by me and considered in my medical decision making (see chart for details).  Iv ns bolus. Morphine iv. reglan iv. Labs sent.   Reviewed nursing notes and prior charts for additional history.   pepcid and maalox po. Po fluids.   Labs reviewed by me - u preg neg. uds +THC.  U/s reviewed by me - no gallstones.   Trial of po fluids - pt tolerating po fluids well.  Abd soft nt. No recurrent nv.   Patient currently appears stable for d/c.  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   Rec pcp f/u.  Return precautions provided.     Final Clinical Impressions(s) / ED Diagnoses   Final diagnoses:  None    ED Discharge Orders    None       Lajean Saver, MD 05/26/19 1057

## 2019-05-26 NOTE — Discharge Instructions (Addendum)
It was our pleasure to provide your ER care today - we hope that you feel better.  Please note that increasingly we are seeing THC use associated with 'cannabinoid hyperemesis syndrome' - causing patients to experience a recurrent vomiting and abdominal pain syndrome - abstaining from [facility] use may well help symptoms resolved and prevent recurrences.   Drink plenty of fluids. Take zofran as need for nausea.  Follow up with primary care doctor in the next 1-2 days for recheck if symptoms fail to improve/resolve.  Return to ER if worse, new symptoms, fevers, persistent vomiting, worsening or severe pain, or other concern.

## 2019-07-02 ENCOUNTER — Ambulatory Visit: Payer: Managed Care, Other (non HMO) | Admitting: Nurse Practitioner

## 2019-07-07 ENCOUNTER — Encounter: Payer: Self-pay | Admitting: Internal Medicine

## 2019-07-07 ENCOUNTER — Telehealth: Payer: Self-pay | Admitting: Internal Medicine

## 2019-07-07 ENCOUNTER — Ambulatory Visit: Payer: Managed Care, Other (non HMO) | Admitting: Nurse Practitioner

## 2019-07-07 NOTE — Progress Notes (Deleted)
Referring Provider: [facility] Primary Care Physician:  [facility] Primary GI:  Dr. Gala Romney  No chief complaint on file.   HPI:   Brooke Greene is a 43 y.o. female who presents for follow-up on GERD and weight loss.  Patient was last seen in our office 12/24/2018 for GERD, weight loss, hematochezia.  The patient's last visit was a virtual office visit due to COVID-19/coronavirus pandemic.  Previously noted pulmonary nodule was worked up and found to be stable, nonprogressive, no associated symptoms.  She did have some weight loss improvement on Megace.    Colonoscopy up-to-date 11/01/2017 which found a single polyp that was hyperplastic and recommended repeat in 10 years.  EGD is same day found nonspecific duodenal changes with biopsies found to be colonic mucosa without specific histopathological changes.  Recommended trial Dexilant.  Last office visit noted intermittently her stomach is bothering her currently on Protonix.  Tried Dexilant which she feels works better but was going to cost $200.  However, she did not receive a co-pay card to try.  Has put on 10 pounds recently.  No other overt GI complaints.  Recommended try to fill Dexilant with a co-pay card, call us if it is too expensive and we can try increasing Protonix to 40 mg twice a day.  Continue to monitor weight.  Follow-up in 6 months.  Today she states   Past Medical History:  Diagnosis Date  . Acid reflux   . Breast cyst bilateral   . COPD (chronic obstructive pulmonary disease) ([place])   . High cholesterol     Past Surgical History:  Procedure Laterality Date  . BIOPSY  11/01/2017   Procedure: BIOPSY;  Surgeon: Daneil Dolin, MD;  [facility];  Service: Endoscopy;;  duodenal bx  . BREAST SURGERY  2002,2003   Cysts on left and right breasts  . COLONOSCOPY WITH PROPOFOL N/A 11/01/2017   Procedure: COLONOSCOPY WITH PROPOFOL;  Surgeon: Daneil Dolin, MD;  [facility];  Service: Endoscopy;  Laterality: N/A;  8:30am  . ESOPHAGOGASTRODUODENOSCOPY (EGD) WITH PROPOFOL N/A 11/01/2017   Procedure: ESOPHAGOGASTRODUODENOSCOPY (EGD) WITH PROPOFOL;  Surgeon: Daneil Dolin, MD;  [facility];  Service: Endoscopy;  Laterality: N/A;  . HERNIA REPAIR  1997,2000   Left and right  . OOPHORECTOMY      Current Outpatient Medications  Medication Sig Dispense Refill  . amLODipine (NORVASC) 5 MG tablet Take 5 mg by mouth daily.  3  . dexlansoprazole (DEXILANT) 60 MG capsule Take 1 capsule (60 mg total) by mouth daily. 30 capsule 3  . ferrous sulfate 325 (65 FE) MG EC tablet Take 325 mg by mouth as needed.    . Multiple Vitamins-Minerals (MULTIVITAMIN WITH MINERALS) tablet Take 1 tablet by mouth daily.    . ondansetron (ZOFRAN ODT) 8 MG disintegrating tablet Take 1 tablet (8 mg total) by mouth every 8 (eight) hours as needed for nausea or vomiting. 10 tablet 0  . pantoprazole (PROTONIX) 40 MG tablet Take 40 mg by mouth daily.     No current facility-administered medications for this visit.     Allergies as of 07/07/2019 - Review Complete 05/26/2019  Allergen Reaction Noted  . Penicillins Rash 12/09/2011    Family History  Problem Relation Age of Onset  . HIV Father   . Colon cancer Neg Hx   . Gastric cancer Neg Hx   . Esophageal cancer Neg Hx  Social History   Socioeconomic History  . Marital status: Single    Spouse name: Not on file  . Number of children: Not on file  . Years of education: Not on file  . Highest education level: Not on file  Occupational History  . Not on file  Social Needs  . Financial resource strain: Not on file  . Food insecurity    Worry: Not on file    Inability: Not on file  . Transportation needs    Medical: Not on file    Non-medical: Not on file  Tobacco Use  . Smoking status: Former Smoker    Packs/day: 0.25    Years: 18.00    Pack years: 4.50    Types: Cigarettes    Quit date: 09/14/2017     Years since quitting: 1.8  . Smokeless tobacco: Never Used  Substance and Sexual Activity  . Alcohol use: Yes    Comment: on the weekends-shots/2 beers  . Drug use: Yes    Frequency: 7.0 times per week    Types: Marijuana    Comment: last used 05/25/19  . Sexual activity: Yes    Birth control/protection: None  Lifestyle  . Physical activity    Days per week: Not on file    Minutes per session: Not on file  . Stress: Not on file  Relationships  . Social Herbalist on phone: Not on file    Gets together: Not on file    Attends religious service: Not on file    Active member of club or organization: Not on file    Attends meetings of clubs or organizations: Not on file    Relationship status: Not on file  Other Topics Concern  . Not on file  Social History Narrative  . Not on file    Review of Systems: General: Negative for anorexia, weight loss, fever, chills, fatigue, weakness. Eyes: Negative for vision changes.  ENT: Negative for hoarseness, difficulty swallowing , nasal congestion. CV: Negative for chest pain, angina, palpitations, dyspnea on exertion, peripheral edema.  Respiratory: Negative for dyspnea at rest, dyspnea on exertion, cough, sputum, wheezing.  GI: See history of present illness. GU:  Negative for dysuria, hematuria, urinary incontinence, urinary frequency, nocturnal urination.  MS: Negative for joint pain, low back pain.  Derm: Negative for rash or itching.  Neuro: Negative for weakness, abnormal sensation, seizure, frequent headaches, memory loss, confusion.  Psych: Negative for anxiety, depression, suicidal ideation, hallucinations.  Endo: Negative for unusual weight change.  Heme: Negative for bruising or bleeding. Allergy: Negative for rash or hives.   Physical Exam: There were no vitals taken for this visit. General:   Alert and oriented. Pleasant and cooperative. Well-nourished and well-developed.  Head:  Normocephalic and atraumatic.  Eyes:  Without icterus, sclera clear and conjunctiva pink.  Ears:  Normal auditory acuity. Mouth:  No deformity or lesions, oral mucosa pink.  Throat/Neck:  Supple, without mass or thyromegaly. Cardiovascular:  S1, S2 present without murmurs appreciated. Normal pulses noted. Extremities without clubbing or edema. Respiratory:  Clear to auscultation bilaterally. No wheezes, rales, or rhonchi. No distress.  Gastrointestinal:  +BS, soft, non-tender and non-distended. No HSM noted. No guarding or rebound. No masses appreciated.  Rectal:  Deferred  Musculoskalatal:  Symmetrical without gross deformities. Normal posture. Skin:  Intact without significant lesions or rashes. Neurologic:  Alert and oriented x4;  grossly normal neurologically. Psych:  Alert and cooperative. Normal mood and affect. Heme/Lymph/Immune: No significant  cervical adenopathy. No excessive bruising noted.    07/07/2019 1:32 PM   Disclaimer: This note was dictated with voice recognition software. Similar sounding words can inadvertently be transcribed and may not be corrected upon review.

## 2019-07-07 NOTE — Telephone Encounter (Signed)
PATIENT WAS A NO SHOW AND LETTER SENT  °

## 2020-07-22 ENCOUNTER — Telehealth: Payer: Self-pay | Admitting: *Deleted

## 2020-07-22 ENCOUNTER — Telehealth (INDEPENDENT_AMBULATORY_CARE_PROVIDER_SITE_OTHER): Payer: 59 | Admitting: Gastroenterology

## 2020-07-22 ENCOUNTER — Encounter: Payer: Self-pay | Admitting: Gastroenterology

## 2020-07-22 ENCOUNTER — Other Ambulatory Visit: Payer: Self-pay

## 2020-07-22 DIAGNOSIS — K921 Melena: Secondary | ICD-10-CM

## 2020-07-22 MED ORDER — HYDROCORTISONE (PERIANAL) 2.5 % EX CREA: 1.0000 "application " | TOPICAL_CREAM | Freq: Two times a day (BID) | CUTANEOUS | 1 refills | Status: AC

## 2020-07-22 NOTE — Progress Notes (Signed)
Primary Care Physician:  [facility]  Primary GI: Dr. Gala Romney   Patient Location: Home   Provider Location: [place] office   Reason for Visit: Rectal bleeding   Persons present on the virtual encounter, with roles: Patient and NP   Total time (minutes) spent on medical discussion: 20 minutes   Due to COVID-19, visit was conducted using virtual method.  Visit was requested by patient.  Virtual Visit via MyChart Video Note Due to COVID-19, visit is conducted virtually and was requested by patient.   I connected with Brooke Greene on 07/22/20 at  8:00 AM EST by telephone and verified that I am speaking with the correct person using two identifiers.   I discussed the limitations, risks, security and privacy concerns of performing an evaluation and management service by telephone and the availability of in person appointments. I also discussed with the patient that there may be a patient responsible charge related to this service. The patient expressed understanding and agreed to proceed.  Chief Complaint  Patient presents with  . Rectal Bleeding     History of Present Illness: 44 year old female with history of weight loss, GERD, rectal bleeding, with last colonoscopy March 2019 noting hyperplastic polyp and internal hemorrhoids. EGD 2019 with normal esophagus and stomach, non-specific duodenal changes with negative biopsies.    Got suppositories from PCP  then started having bleeding. Went to [employer] and got suppositories last week. Felt like her hemorrhoids were inflamed. Didn't have bleeding till started the suppositories. No bleeding today or yesterday. No rectal pain. Feels prolapsing tissue and has to push back in.   Chronic constipation. Won't use bathroom at work. Has BM once in morning and sometimes in evenings. Sometimes straining. Sits on toilet about 10-15 minutes. Mon-Friday 10 hour days.   Past Medical History:  Diagnosis Date  . Acid reflux    . Breast cyst bilateral   . COPD (chronic obstructive pulmonary disease) ([place])   . High cholesterol      Past Surgical History:  Procedure Laterality Date  . BIOPSY  11/01/2017   Procedure: BIOPSY;  Surgeon: Daneil Dolin, MD;  [facility];  Service: Endoscopy;;  duodenal bx  . BREAST SURGERY  2002,2003   Cysts on left and right breasts  . COLONOSCOPY WITH PROPOFOL N/A 11/01/2017   hyperplastic polyp and internal hemorrhoids  . ESOPHAGOGASTRODUODENOSCOPY (EGD) WITH PROPOFOL N/A 11/01/2017   normal esophagus and stomach, non-specific changes in duodenum but negative biopsies  . HERNIA REPAIR  1997,2000   Left and right  . OOPHORECTOMY       Current Meds  Medication Sig  . amLODipine (NORVASC) 5 MG tablet Take 5 mg by mouth daily.  . Multiple Vitamins-Minerals (MULTIVITAMIN WITH MINERALS) tablet Take 1 tablet by mouth daily.  . pantoprazole (PROTONIX) 40 MG tablet Take 40 mg by mouth daily.     Family History  Problem Relation Age of Onset  . HIV Father   . Colon cancer Neg Hx   . Gastric cancer Neg Hx   . Esophageal cancer Neg Hx     Social History   Socioeconomic History  . Marital status: Single    Spouse name: Not on file  . Number of children: Not on file  . Years of education: Not on file  . Highest education level: Not on file  Occupational History  . Not on file  Tobacco Use  . Smoking status: Former Smoker  Packs/day: 0.25    Years: 18.00    Pack years: 4.50    Types: Cigarettes    Quit date: 09/14/2017    Years since quitting: 2.8  . Smokeless tobacco: Never Used  Vaping Use  . Vaping Use: Every day  . Start date: 12/12/2017  Substance and Sexual Activity  . Alcohol use: Yes    Comment: on the weekends-shots/2 beers  . Drug use: Yes    Frequency: 7.0 times per week    Types: Marijuana    Comment: last used 05/25/19  . Sexual activity: Yes    Birth control/protection: None  Other Topics Concern  . Not on file  Social History  Narrative  . Not on file   Social Determinants of Health   Financial Resource Strain: Not on file  Food Insecurity: Not on file  Transportation Needs: Not on file  Physical Activity: Not on file  Stress: Not on file  Social Connections: Not on file       Review of Systems: Gen: Denies fever, chills, anorexia. Denies fatigue, weakness, weight loss.  CV: Denies chest pain, palpitations, syncope, peripheral edema, and claudication. Resp: Denies dyspnea at rest, cough, wheezing, coughing up blood, and pleurisy. GI: see HPI Derm: Denies rash, itching, dry skin Psych: Denies depression, anxiety, memory loss, confusion. No homicidal or suicidal ideation.  Heme: see HPI.  Observations/Objective: No distress. Unable to perform physical exam due to video encounter.  Assessment and Plan: Pleasant 44 year old female with rectal bleeding in setting of symptomatic hemorrhoids, Grade 3 by description. Colonoscopy fairly recently on file (2019) with internal hemorrhoids.   She would be an ideal candidate for banding. We will treat supportively with Anusol cream BID for 5-7 days, avoid straining, limit toilet time, and work on more productive BMs and avoiding constipation. I believe she would do well with Benefiber but may need additional agent such as low dose Linzess. She will let me know how Benefiber works for her.  Due to her schedule, needs late afternoon appointments. Will try to accommodate and get the latest afternoon slot that we can.   I reviewed risks and benefits of hemorrhoid banding in detail. She is eager to proceed with this. We will be in touch with her shortly regarding an appointment time.   Follow Up Instructions:    I discussed the assessment and treatment plan with the patient. The patient was provided an opportunity to ask questions and all were answered. The patient agreed with the plan and demonstrated an understanding of the instructions.   The patient was advised  to call back or seek an in-person evaluation if the symptoms worsen or if the condition fails to improve as anticipated.  I provided 20 minutes of non-face-to-face time during this encounter.  Annitta Needs, PhD, ANP-BC [facility]

## 2020-07-22 NOTE — Patient Instructions (Signed)
I recommend starting Benefiber 2 teaspoons daily, mixing in your beverage of choice. Please let me know if this doesn't help make bowel movements more productive. We may need to try something else.  Avoid straining, prolonged toilet time (no more than 2-3 minutes on toilet), and constipation.  I have sent in a rectal cream to use twice a day for 5-7 days.  I will be seeing you for a hemorrhoid banding in the near future. We will be finding an appointment for you and try to work with your schedule.  Have a Merry Christmas!   It was a pleasure to see you today. I want to create trusting relationships with patients to provide genuine, compassionate, and quality care. I value your feedback. If you receive a survey regarding your visit,  I greatly appreciate you taking time to fill this out.   Annitta Needs, PhD, ANP-BC [facility]

## 2020-07-22 NOTE — Telephone Encounter (Signed)
Brooke Greene, you are scheduled for a virtual visit with your provider today.  Just as we do with appointments in the office, we must obtain your consent to participate.  Your consent will be active for this visit and any virtual visit you may have with one of our providers in the next 365 days.  If you have a MyChart account, I can also send a copy of this consent to you electronically.  All virtual visits are billed to your insurance company just like a traditional visit in the office.  As this is a virtual visit, video technology does not allow for your provider to perform a traditional examination.  This may limit your provider's ability to fully assess your condition.  If your provider identifies any concerns that need to be evaluated in person or the need to arrange testing such as labs, EKG, etc, we will make arrangements to do so.  Although advances in technology are sophisticated, we cannot ensure that it will always work on either your end or our end.  If the connection with a video visit is poor, we may have to switch to a telephone visit.  With either a video or telephone visit, we are not always able to ensure that we have a secure connection.   I need to obtain your verbal consent now.   Are you willing to proceed with your visit today?

## 2020-07-22 NOTE — Telephone Encounter (Signed)
Pt consented to a virtual visit. 

## 2020-07-23 ENCOUNTER — Telehealth: Payer: Self-pay | Admitting: Gastroenterology

## 2020-07-23 NOTE — Progress Notes (Signed)
Cc'ed to pcp °

## 2020-07-23 NOTE — Telephone Encounter (Signed)
PATIENT NEEDS APPOINTMENT OPENING FOR A BANDING PLEASE

## 2020-07-26 NOTE — Telephone Encounter (Signed)
L/M FOR PATIENT ABOUT HER APPOINTMENT

## 2020-07-26 NOTE — Telephone Encounter (Signed)
I made a 3:30 appt slot on 12/29 per Anna's request

## 2020-08-11 ENCOUNTER — Encounter: Payer: Self-pay | Admitting: Gastroenterology

## 2020-08-11 ENCOUNTER — Other Ambulatory Visit: Payer: Self-pay

## 2020-08-11 ENCOUNTER — Ambulatory Visit (INDEPENDENT_AMBULATORY_CARE_PROVIDER_SITE_OTHER): Payer: 59 | Admitting: Gastroenterology

## 2020-08-11 VITALS — BP 113/77 | HR 104 | Temp 98.0°F | Ht 63.0 in | Wt 98.4 lb

## 2020-08-11 DIAGNOSIS — K648 Other hemorrhoids: Secondary | ICD-10-CM | POA: Diagnosis not present

## 2020-08-11 NOTE — Patient Instructions (Signed)
Please call with any rectal bleeding, pain, discomfort, itching, or prolapsing tissue.  Avoid constipation, limit toilet time to 2-3 minutes.  Benefiber 2 teaspoons daily can be helpful for bowel regimen.  We will see you back as needed. Please call if you feel a banding would be helpful!  I enjoyed seeing you again today! As you know, I value our relationship and want to provide genuine, compassionate, and quality care. I welcome your feedback. If you receive a survey regarding your visit,  I greatly appreciate you taking time to fill this out. See you next time!  Gelene Mink, PhD, ANP-BC [facility]

## 2020-08-11 NOTE — Progress Notes (Signed)
Referring Provider: [facility] Primary Care Physician:  [facility] Primary GI: Dr. Jena Gauss  Chief Complaint  Patient presents with  . Hemorrhoids    Banding     HPI:   Brooke Greene is a 44 y.o. female presenting today with a history of  rectal bleeding, with colonoscopy March 2019 revealing hyperplastic polyp and internal hemorrhoids. She was seen virtually on 07/22/20 with symptomatic hemorrhoids.   Returns today hesitant for banding. No rectal bleeding, pain, itching, discomfort, or prolapsing noted. She has had better bowel regimen recently without constipation. No abdominal pain. Uses anusol cream prn.   Past Medical History:  Diagnosis Date  . Acid reflux   . Breast cyst bilateral   . COPD (chronic obstructive pulmonary disease) ([facility])   . High cholesterol     Past Surgical History:  Procedure Laterality Date  . BIOPSY  11/01/2017   Procedure: BIOPSY;  Surgeon: Corbin Ade, MD;  [facility];  Service: Endoscopy;;  duodenal bx  . BREAST SURGERY  2002,2003   Cysts on left and right breasts  . COLONOSCOPY WITH PROPOFOL N/A 11/01/2017   hyperplastic polyp and internal hemorrhoids  . ESOPHAGOGASTRODUODENOSCOPY (EGD) WITH PROPOFOL N/A 11/01/2017   normal esophagus and stomach, non-specific changes in duodenum but negative biopsies  . HERNIA REPAIR  1997,2000   Left and right  . OOPHORECTOMY      Current Outpatient Medications  Medication Sig Dispense Refill  . amLODipine (NORVASC) 5 MG tablet Take 5 mg by mouth daily.  3  . hydrocortisone (ANUSOL-HC) 2.5 % rectal cream Place 1 application rectally 2 (two) times daily. 30 g 1  . Multiple Vitamins-Minerals (MULTIVITAMIN WITH MINERALS) tablet Take 1 tablet by mouth daily.    . pantoprazole (PROTONIX) 40 MG tablet Take 40 mg by mouth daily.     No current facility-administered medications for this visit.    Allergies as of 08/11/2020 - Review Complete 08/11/2020  Allergen  Reaction Noted  . Penicillins Rash 12/09/2011    Family History  Problem Relation Age of Onset  . HIV Father   . Colon cancer Neg Hx   . Gastric cancer Neg Hx   . Esophageal cancer Neg Hx     Social History   Socioeconomic History  . Marital status: Single    Spouse name: Not on file  . Number of children: Not on file  . Years of education: Not on file  . Highest education level: Not on file  Occupational History  . Not on file  Tobacco Use  . Smoking status: Former Smoker    Packs/day: 0.25    Years: 18.00    Pack years: 4.50    Types: Cigarettes    Quit date: 09/14/2017    Years since quitting: 2.9  . Smokeless tobacco: Never Used  Vaping Use  . Vaping Use: Every day  . Start date: 12/12/2017  Substance and Sexual Activity  . Alcohol use: Yes    Comment: on the weekends-shots/2 beers  . Drug use: Yes    Frequency: 7.0 times per week    Types: Marijuana    Comment: last used 05/25/19  . Sexual activity: Yes    Birth control/protection: None  Other Topics Concern  . Not on file  Social History Narrative  . Not on file   Social Determinants of Health   Financial Resource Strain: Not on file  Food Insecurity: Not on file  Transportation Needs: Not on file  Physical Activity: Not on file  Stress: Not on file  Social Connections: Not on file    Review of Systems: Gen: Denies fever, chills, anorexia. Denies fatigue, weakness, weight loss.  CV: Denies chest pain, palpitations, syncope, peripheral edema, and claudication. Resp: Denies dyspnea at rest, cough, wheezing, coughing up blood, and pleurisy. GI: see hPI Derm: Denies rash, itching, dry skin Psych: Denies depression, anxiety, memory loss, confusion. No homicidal or suicidal ideation.  Heme: Denies bruising, bleeding, and enlarged lymph nodes.  Physical Exam: BP 113/77   Pulse (!) 104   Temp 98 F (36.7 C) (Temporal)   Ht 5\' 3"  (1.6 m)   Wt 98 lb 6.4 oz (44.6 kg)   BMI 17.43 kg/m  General:    Alert and oriented. No distress noted. Pleasant and cooperative.  Head:  Normocephalic and atraumatic. Eyes:  Conjuctiva clear without scleral icterus. Mouth:  Mask in place Rectal: non-thrombosed exterior hemorrhoid. DRE without mass. Query left lateral internal hemorrhoid, as this column more prominent with digital palpation.  Msk:  Symmetrical without gross deformities. Normal posture. Extremities:  Without edema. Neurologic:  Alert and  oriented x4 Psych:  Alert and cooperative. Normal mood and affect.  ASSESSMENT/PLAN: Brooke Greene is a 44 y.o. female presenting today with history of symptomatic hemorrhoids and now with quiescent symptoms. She desires to postpone banding now, and we discussed signs/symptoms that would need to be reported in near future. Colonoscopy on file from 2019 with internal hemorrhoids.   Limit toilet time, avoid straining, and recommend Benefiber daily. She may use the Anusol cream prn. If in the future she desires banding, she would be an ideal candidate.  Return prn.  Annitta Needs, PhD, ANP-BC [facility]

## 2022-06-10 LAB — TSH: TSH: 0.29 — AB (ref 0.41–5.90)

## 2022-08-10 ENCOUNTER — Encounter: Payer: Self-pay | Admitting: "Endocrinology

## 2022-08-10 ENCOUNTER — Ambulatory Visit (INDEPENDENT_AMBULATORY_CARE_PROVIDER_SITE_OTHER): Payer: BC Managed Care – PPO | Admitting: "Endocrinology

## 2022-08-10 VITALS — BP 118/76 | HR 88 | Ht 63.0 in | Wt 102.0 lb

## 2022-08-10 DIAGNOSIS — R7989 Other specified abnormal findings of blood chemistry: Secondary | ICD-10-CM | POA: Insufficient documentation

## 2022-08-10 DIAGNOSIS — I10 Essential (primary) hypertension: Secondary | ICD-10-CM | POA: Diagnosis not present

## 2022-08-10 NOTE — Progress Notes (Signed)
Endocrinology Consult Note                                            08/10/2022, 2:05 PM   Subjective:    Patient ID: Brooke Greene, female    DOB: 11-10-1975, PCP Practice, [facility]   Past Medical History:  Diagnosis Date   Acid reflux    Breast cyst bilateral    COPD (chronic obstructive pulmonary disease) (HCC)    High cholesterol    Past Surgical History:  Procedure Laterality Date   BIOPSY  11/01/2017   Procedure: BIOPSY;  Surgeon: Daneil Dolin, MD;  [facility];  Service: Endoscopy;;  duodenal bx   BREAST SURGERY  2002,2003   Cysts on left and right breasts   COLONOSCOPY WITH PROPOFOL N/A 11/01/2017   hyperplastic polyp and internal hemorrhoids   ESOPHAGOGASTRODUODENOSCOPY (EGD) WITH PROPOFOL N/A 11/01/2017   normal esophagus and stomach, non-specific changes in duodenum but negative biopsies   HERNIA REPAIR  1997,2000   Left and right   OOPHORECTOMY     Social History   Socioeconomic History   Marital status: Single    Spouse name: Not on file   Number of children: Not on file   Years of education: Not on file   Highest education level: Not on file  Occupational History   Not on file  Tobacco Use   Smoking status: Former    Packs/day: 0.25    Years: 18.00    Total pack years: 4.50    Types: Cigarettes    Quit date: 09/14/2017    Years since quitting: 4.9   Smokeless tobacco: Never  Vaping Use   Vaping Use: Every day   Start date: 12/12/2017  Substance and Sexual Activity   Alcohol use: Yes    Comment: on the weekends-shots/2 beers   Drug use: Yes    Frequency: 7.0 times per week    Types: Marijuana    Comment: last used 05/25/19   Sexual activity: Yes    Birth control/protection: None  Other Topics Concern   Not on file  Social History Narrative   Not on file   Social Determinants of Health   Financial Resource Strain: Not on file  Food Insecurity: Not on file  Transportation Needs: Not on file  Physical  Activity: Not on file  Stress: Not on file  Social Connections: Not on file   Family History  Problem Relation Age of Onset   HIV Father    Colon cancer Neg Hx    Gastric cancer Neg Hx    Esophageal cancer Neg Hx    Outpatient Encounter Medications as of 08/10/2022  Medication Sig   losartan (COZAAR) 25 MG tablet Take 25 mg by mouth daily.   mirtazapine (REMERON) 15 MG tablet Take 15 mg by mouth at bedtime.   hydrocortisone (ANUSOL-HC) 2.5 % rectal cream Place 1 application rectally 2 (two) times daily.   Multiple Vitamins-Minerals (MULTIVITAMIN WITH MINERALS) tablet Take 1 tablet by mouth daily.   pantoprazole (PROTONIX) 40 MG tablet Take 40 mg by mouth daily. (Patient not taking: Reported on 08/10/2022)   [DISCONTINUED] amLODipine (NORVASC) 5 MG tablet Take 5 mg by mouth daily.   No facility-administered encounter medications on file as of 08/10/2022.   ALLERGIES: Allergies  Allergen Reactions   Amlodipine Rash  Penicillins Rash    Has patient had a PCN reaction causing immediate rash, facial/tongue/throat swelling, SOB or lightheadedness with hypotension: Yes Has patient had a PCN reaction causing severe rash involving mucus membranes or skin necrosis: No Has patient had a PCN reaction that required hospitalization: No Has patient had a PCN reaction occurring within the last 10 years: Yes If all of the above answers are "NO", then may proceed with Cephalosporin use.     VACCINATION STATUS:  There is no immunization history on file for this patient.  HPI Brooke Greene is 46 y.o. female who presents today with a medical history as above. she is being seen in consultation for abnormal thyroid function tests requested by [facility], [facility].  She denies any prior history of thyroid dysfunction.  She was found to have suppressed TSH of 0.288 on routine labs in October 2023.  She is not on any thyroid hormone nor antithyroid interventions.  She denies any palpitations,  tremors, heat/cold intolerance.  She denies dysphagia or shortness of breath, nor voice change. She denies any family history of thyroid dysfunction.  She does have hypertension on losartan 25 mg once a day.  She is a former smoker, currently smokes weed. Reportedly, always a light build current BMI of 18.07.  Review of Systems  Constitutional: no recent weight gain/loss, no fatigue, no subjective hyperthermia, no subjective hypothermia Eyes: no blurry vision, no xerophthalmia ENT: no sore throat, no nodules palpated in throat, no dysphagia/odynophagia, no hoarseness Cardiovascular: no Chest Pain, no Shortness of Breath, no palpitations, no leg swelling Respiratory: no cough, no shortness of breath Gastrointestinal: no Nausea/Vomiting/Diarhhea Musculoskeletal: no muscle/joint aches Skin: no rashes Neurological: no tremors, no numbness, no tingling, no dizziness Psychiatric: no depression, no anxiety  Objective:       08/10/2022    1:24 PM 08/11/2020    3:45 PM 05/26/2019    5:52 AM  Vitals with BMI  Height '5\' 3"'$  '5\' 3"'$    Weight 102 lbs 98 lbs 6 oz   BMI 84.16 60.63   Systolic 016 010 932  Diastolic 76 77 88  Pulse 88 104     BP 118/76   Pulse 88   Ht '5\' 3"'$  (1.6 m)   Wt 102 lb (46.3 kg)   BMI 18.07 kg/m   Wt Readings from Last 3 Encounters:  08/10/22 102 lb (46.3 kg)  08/11/20 98 lb 6.4 oz (44.6 kg)  05/26/19 105 lb (47.6 kg)    Physical Exam  Constitutional:  Body mass index is 18.07 kg/m.,  not in acute distress, normal state of mind Eyes: PERRLA, EOMI, no exophthalmos ENT: moist mucous membranes, no gross thyromegaly, no gross cervical lymphadenopathy Cardiovascular: normal precordial activity, Regular Rate and Rhythm, no Murmur/Rubs/Gallops Respiratory:  adequate breathing efforts, no gross chest deformity, Clear to auscultation bilaterally Gastrointestinal: abdomen soft, Non -tender, No distension, Bowel Sounds present, no gross organomegaly Musculoskeletal: no  gross deformities, strength intact in all four extremities Skin: moist, warm, no rashes Neurological: no tremor with outstretched hands, Deep tendon reflexes normal in bilateral lower extremities.  CMP ( most recent) CMP     Component Value Date/Time   NA 137 05/26/2019 0743   K 3.9 05/26/2019 0743   CL 104 05/26/2019 0743   CO2 23 05/26/2019 0743   GLUCOSE 106 (H) 05/26/2019 0743   BUN 10 05/26/2019 0743   CREATININE 0.62 05/26/2019 0743   CREATININE 0.75 09/26/2017 0943   CALCIUM 8.8 (L) 05/26/2019 0743   PROT  7.2 05/26/2019 0743   ALBUMIN 4.3 05/26/2019 0743   AST 26 05/26/2019 0743   ALT 16 05/26/2019 0743   ALKPHOS 35 (L) 05/26/2019 0743   BILITOT 0.3 05/26/2019 0743   GFRNONAA >60 05/26/2019 0743   GFRAA >60 05/26/2019 0743   Recent Results (from the past 2160 hour(s))  TSH     Status: Abnormal   Collection Time: 06/10/22 12:00 AM  Result Value Ref Range   TSH 0.29 (A) 0.41 - 5.90    Assessment & Plan:   1. Abnormal thyroid blood test  - [place]  is being seen at a kind request of Practice[facility]. - I have reviewed her available thyroid records and clinically evaluated the patient. - Based on these reviews, she has suppressed TSH in October 2023,  however,  there is not sufficient information to proceed with definitive treatment plan.  - she will need a repeat,  more complete thyroid function tests towards confirming the diagnosis.  Her labs will include TSH, free T4/free T3, and antithyroid antibodies. -she will return in 1 week to review her repeat labs.   If her  labs are suggestive of hyperthyroidism, she will be considered for thyroid uptake and scan to confirm the diagnosis.  - I did not initiate any new prescriptions today. - she is advised to maintain close follow up with [facility] for primary care needs.   - Time spent with the patient: 45 minutes, of which >50% was spent in  counseling her about her abnormal thyroid  function tests and the rest in obtaining information about her symptoms, reviewing her previous labs/studies ( including abstractions from other facilities),  evaluations, and treatments,  and developing a plan to confirm diagnosis and long term treatment based on the latest standards of care/guidelines; and documenting her care.  [place] participated in the discussions, expressed understanding, and voiced agreement with the above plans.  All questions were answered to her satisfaction. she is encouraged to contact clinic should she have any questions or concerns prior to her return visit.  Follow up plan: Return in about 1 week (around 08/17/2022) for Labs Today- Non-Fasting Ok.  Glade Lloyd, MD [facility] [address] [place], [place] [address] Phone: [phone]  Fax: [phone]     08/10/2022, 2:05 PM  This note was partially dictated with voice recognition software. Similar sounding words can be transcribed inadequately or may not  be corrected upon review.

## 2022-08-11 LAB — T4, FREE: Free T4: 1.01 ng/dL (ref 0.82–1.77)

## 2022-08-11 LAB — THYROGLOBULIN ANTIBODY: Thyroglobulin Antibody: <1 IU/mL (ref 0.0–0.9)

## 2022-08-11 LAB — TSH: TSH: 0.891 u[IU]/mL (ref 0.450–4.500)

## 2022-08-11 LAB — THYROID PEROXIDASE ANTIBODY: Thyroperoxidase Ab SerPl-aCnc: <9 IU/mL (ref 0–34)

## 2022-08-11 LAB — T3, FREE: T3, Free: 2.8 pg/mL (ref 2.0–4.4)

## 2022-08-17 ENCOUNTER — Ambulatory Visit (INDEPENDENT_AMBULATORY_CARE_PROVIDER_SITE_OTHER): Payer: BC Managed Care – PPO | Admitting: "Endocrinology

## 2022-08-17 ENCOUNTER — Encounter: Payer: Self-pay | Admitting: "Endocrinology

## 2022-08-17 VITALS — BP 130/76 | HR 80 | Ht 63.0 in | Wt 99.6 lb

## 2022-08-17 DIAGNOSIS — R7989 Other specified abnormal findings of blood chemistry: Secondary | ICD-10-CM

## 2022-08-17 NOTE — Progress Notes (Signed)
08/17/2022, 5:36 PM   Endocrinology follow-up note  Subjective:    Patient ID: Brooke Greene, female    DOB: 1976/04/03, PCP [facility]   Past Medical History:  Diagnosis Date   Acid reflux    Breast cyst bilateral    COPD (chronic obstructive pulmonary disease) (HCC)    High cholesterol    Past Surgical History:  Procedure Laterality Date   BIOPSY  11/01/2017   Procedure: BIOPSY;  Surgeon: Daneil Dolin, MD;  [facility];  Service: Endoscopy;;  duodenal bx   BREAST SURGERY  2002,2003   Cysts on left and right breasts   COLONOSCOPY WITH PROPOFOL N/A 11/01/2017   hyperplastic polyp and internal hemorrhoids   ESOPHAGOGASTRODUODENOSCOPY (EGD) WITH PROPOFOL N/A 11/01/2017   normal esophagus and stomach, non-specific changes in duodenum but negative biopsies   HERNIA REPAIR  1997,2000   Left and right   OOPHORECTOMY     Social History   Socioeconomic History   Marital status: Single    Spouse name: Not on file   Number of children: Not on file   Years of education: Not on file   Highest education level: Not on file  Occupational History   Not on file  Tobacco Use   Smoking status: Former    Packs/day: 0.25    Years: 18.00    Total pack years: 4.50    Types: Cigarettes    Quit date: 09/14/2017    Years since quitting: 4.9   Smokeless tobacco: Never  Vaping Use   Vaping Use: Every day   Start date: 12/12/2017  Substance and Sexual Activity   Alcohol use: Yes    Comment: on the weekends-shots/2 beers   Drug use: Yes    Frequency: 7.0 times per week    Types: Marijuana    Comment: last used 05/25/19   Sexual activity: Yes    Birth control/protection: None  Other Topics Concern   Not on file  Social History Narrative   Not on file   Social Determinants of Health   Financial Resource Strain: Not on file  Food Insecurity: Not on file  Transportation Needs: Not on file  Physical  Activity: Not on file  Stress: Not on file  Social Connections: Not on file   Family History  Problem Relation Age of Onset   HIV Father    Colon cancer Neg Hx    Gastric cancer Neg Hx    Esophageal cancer Neg Hx    Outpatient Encounter Medications as of 08/17/2022  Medication Sig   hydrocortisone (ANUSOL-HC) 2.5 % rectal cream Place 1 application rectally 2 (two) times daily.   losartan (COZAAR) 25 MG tablet Take 25 mg by mouth daily.   mirtazapine (REMERON) 15 MG tablet Take 15 mg by mouth at bedtime.   Multiple Vitamins-Minerals (MULTIVITAMIN WITH MINERALS) tablet Take 1 tablet by mouth daily.   pantoprazole (PROTONIX) 40 MG tablet Take 40 mg by mouth daily. (Patient not taking: Reported on 08/10/2022)   No facility-administered encounter medications on file as of 08/17/2022.   ALLERGIES: Allergies  Allergen Reactions   Amlodipine Rash   Penicillins Rash    Has patient had a PCN reaction causing  immediate rash, facial/tongue/throat swelling, SOB or lightheadedness with hypotension: Yes Has patient had a PCN reaction causing severe rash involving mucus membranes or skin necrosis: No Has patient had a PCN reaction that required hospitalization: No Has patient had a PCN reaction occurring within the last 10 years: Yes If all of the above answers are "NO", then may proceed with Cephalosporin use.     VACCINATION STATUS:  There is no immunization history on file for this patient.  HPI Brooke Greene is 47 y.o. female who presents today with a medical history as above. she is being seen in follow-up after she was seen in consultation for abnormal thyroid function tests requested by [facility].  She denies any prior history of thyroid dysfunction.  She was found to have suppressed TSH of 0.288 on routine labs in October 2023.  She did not require any intervention.  Her previsit full set of thyroid function tests are consistent with euthyroid state.  She denies any  palpitations, tremors, heat/cold intolerance.  She denies dysphagia or shortness of breath, nor voice change. She denies any family history of thyroid dysfunction.  She does have hypertension on losartan 25 mg once a day.  She is a former smoker, currently smokes weed. Reportedly, always a light build current BMI of 18.07.  Review of Systems  Constitutional: no recent weight gain/loss, no fatigue, no subjective hyperthermia, no subjective hypothermia   Objective:       08/17/2022    1:10 PM 08/10/2022    1:24 PM 08/11/2020    3:45 PM  Vitals with BMI  Height '5\' 3"'$  '5\' 3"'$  '5\' 3"'$   Weight 99 lbs 10 oz 102 lbs 98 lbs 6 oz  BMI 17.65 09.62 83.66  Systolic 294 765 465  Diastolic 76 76 77  Pulse 80 88 104    BP 130/76   Pulse 80   Ht '5\' 3"'$  (1.6 m)   Wt 99 lb 9.6 oz (45.2 kg)   BMI 17.64 kg/m   Wt Readings from Last 3 Encounters:  08/17/22 99 lb 9.6 oz (45.2 kg)  08/10/22 102 lb (46.3 kg)  08/11/20 98 lb 6.4 oz (44.6 kg)    Physical Exam  Constitutional:  Body mass index is 17.64 kg/m.,  not in acute distress, normal state of mind Eyes: PERRLA, EOMI, no exophthalmos ENT: moist mucous membranes, no gross thyromegaly, no gross cervical lymphadenopathy   CMP ( most recent) CMP     Component Value Date/Time   NA 137 05/26/2019 0743   K 3.9 05/26/2019 0743   CL 104 05/26/2019 0743   CO2 23 05/26/2019 0743   GLUCOSE 106 (H) 05/26/2019 0743   BUN 10 05/26/2019 0743   CREATININE 0.62 05/26/2019 0743   CREATININE 0.75 09/26/2017 0943   CALCIUM 8.8 (L) 05/26/2019 0743   PROT 7.2 05/26/2019 0743   ALBUMIN 4.3 05/26/2019 0743   AST 26 05/26/2019 0743   ALT 16 05/26/2019 0743   ALKPHOS 35 (L) 05/26/2019 0743   BILITOT 0.3 05/26/2019 0743   GFRNONAA >60 05/26/2019 0743   GFRAA >60 05/26/2019 0743   Recent Results (from the past 2160 hour(s))  TSH     Status: Abnormal   Collection Time: 06/10/22 12:00 AM  Result Value Ref Range   TSH 0.29 (A) 0.41 - 5.90  TSH     Status:  None   Collection Time: 08/10/22  2:03 PM  Result Value Ref Range   TSH 0.891 0.450 - 4.500 uIU/mL  T4,  free     Status: None   Collection Time: 08/10/22  2:03 PM  Result Value Ref Range   Free T4 1.01 0.82 - 1.77 ng/dL  T3, free     Status: None   Collection Time: 08/10/22  2:03 PM  Result Value Ref Range   T3, Free 2.8 2.0 - 4.4 pg/mL  Thyroid peroxidase antibody     Status: None   Collection Time: 08/10/22  2:03 PM  Result Value Ref Range   Thyroperoxidase Ab SerPl-aCnc <9 0 - 34 IU/mL  Thyroglobulin antibody     Status: None   Collection Time: 08/10/22  2:03 PM  Result Value Ref Range   Thyroglobulin Antibody <1.0 0.0 - 0.9 IU/mL    Comment: Thyroglobulin Antibody measured by [employer] Methodology    Assessment & Plan:   1. Abnormal thyroid blood test  - [place]  is being seen at a kind request of [facility]. - I have reviewed her  recent and available thyroid records and clinically evaluated the patient. - Based on these reviews, she has euthyroid state.  Her previously suppressed TSH now has corrected.  She will not need any antithyroid intervention at this time.  Her labs did not show any evidence of thyroid antibodies either.  She will return only as needed.   - I did not initiate any new prescriptions today. - she is advised to maintain close follow up with [facility] for primary care needs.   I spent 21 minutes in the care of the patient today including review of labs from Thyroid Function, CMP, and other relevant labs ; imaging/biopsy records (current and previous including abstractions from other facilities); face-to-face time discussing  her lab results and symptoms, medications doses, her options of short and long term treatment based on the latest standards of care / guidelines;   and documenting the encounter.  [place]  participated in the discussions, expressed understanding, and voiced agreement with the above  plans.  All questions were answered to her satisfaction. she is encouraged to contact clinic should she have any questions or concerns prior to her return visit.   Follow up plan: Return if symptoms worsen or fail to improve.  Glade Lloyd, MD [facility] [address] [place], [place] [address] Phone: [phone]  Fax: [phone]     08/17/2022, 5:36 PM  This note was partially dictated with voice recognition software. Similar sounding words can be transcribed inadequately or may not  be corrected upon review.
